# Patient Record
Sex: Male | Born: 1960
Health system: Southern US, Community
[De-identification: ages and names within clinical notes are randomized; demographics above are authoritative.]

---

## 2011-06-07 DIAGNOSIS — I2510 Atherosclerotic heart disease of native coronary artery without angina pectoris: Secondary | ICD-10-CM | POA: Diagnosis not present

## 2011-06-07 DIAGNOSIS — J019 Acute sinusitis, unspecified: Secondary | ICD-10-CM | POA: Diagnosis not present

## 2011-06-07 DIAGNOSIS — M549 Dorsalgia, unspecified: Secondary | ICD-10-CM | POA: Diagnosis not present

## 2011-06-07 DIAGNOSIS — IMO0002 Reserved for concepts with insufficient information to code with codable children: Secondary | ICD-10-CM | POA: Diagnosis not present

## 2011-06-07 DIAGNOSIS — D518 Other vitamin B12 deficiency anemias: Secondary | ICD-10-CM | POA: Diagnosis not present

## 2012-02-07 DIAGNOSIS — W010XXA Fall on same level from slipping, tripping and stumbling without subsequent striking against object, initial encounter: Secondary | ICD-10-CM | POA: Diagnosis not present

## 2012-02-07 DIAGNOSIS — M545 Low back pain, unspecified: Secondary | ICD-10-CM | POA: Diagnosis not present

## 2012-02-07 DIAGNOSIS — IMO0002 Reserved for concepts with insufficient information to code with codable children: Secondary | ICD-10-CM | POA: Diagnosis not present

## 2012-05-31 DIAGNOSIS — I2510 Atherosclerotic heart disease of native coronary artery without angina pectoris: Secondary | ICD-10-CM | POA: Diagnosis not present

## 2012-05-31 DIAGNOSIS — M545 Low back pain, unspecified: Secondary | ICD-10-CM | POA: Diagnosis not present

## 2012-05-31 DIAGNOSIS — M5430 Sciatica, unspecified side: Secondary | ICD-10-CM | POA: Diagnosis not present

## 2012-05-31 DIAGNOSIS — I252 Old myocardial infarction: Secondary | ICD-10-CM | POA: Diagnosis not present

## 2012-05-31 DIAGNOSIS — R52 Pain, unspecified: Secondary | ICD-10-CM | POA: Diagnosis not present

## 2012-05-31 DIAGNOSIS — Z8673 Personal history of transient ischemic attack (TIA), and cerebral infarction without residual deficits: Secondary | ICD-10-CM | POA: Diagnosis not present

## 2012-06-19 DIAGNOSIS — W010XXA Fall on same level from slipping, tripping and stumbling without subsequent striking against object, initial encounter: Secondary | ICD-10-CM | POA: Diagnosis not present

## 2012-06-19 DIAGNOSIS — Z9119 Patient's noncompliance with other medical treatment and regimen: Secondary | ICD-10-CM | POA: Diagnosis not present

## 2012-06-19 DIAGNOSIS — I252 Old myocardial infarction: Secondary | ICD-10-CM | POA: Diagnosis not present

## 2012-06-19 DIAGNOSIS — Z905 Acquired absence of kidney: Secondary | ICD-10-CM | POA: Diagnosis not present

## 2012-06-19 DIAGNOSIS — Z8673 Personal history of transient ischemic attack (TIA), and cerebral infarction without residual deficits: Secondary | ICD-10-CM | POA: Diagnosis not present

## 2012-06-19 DIAGNOSIS — M549 Dorsalgia, unspecified: Secondary | ICD-10-CM | POA: Diagnosis not present

## 2012-06-19 DIAGNOSIS — I4892 Unspecified atrial flutter: Secondary | ICD-10-CM | POA: Diagnosis not present

## 2012-06-19 DIAGNOSIS — Z7982 Long term (current) use of aspirin: Secondary | ICD-10-CM | POA: Diagnosis not present

## 2012-06-19 DIAGNOSIS — I359 Nonrheumatic aortic valve disorder, unspecified: Secondary | ICD-10-CM | POA: Diagnosis not present

## 2012-06-19 DIAGNOSIS — R0602 Shortness of breath: Secondary | ICD-10-CM | POA: Diagnosis not present

## 2012-06-19 DIAGNOSIS — R9431 Abnormal electrocardiogram [ECG] [EKG]: Secondary | ICD-10-CM | POA: Diagnosis not present

## 2012-06-19 DIAGNOSIS — I4891 Unspecified atrial fibrillation: Secondary | ICD-10-CM | POA: Diagnosis not present

## 2012-06-19 DIAGNOSIS — N289 Disorder of kidney and ureter, unspecified: Secondary | ICD-10-CM | POA: Diagnosis not present

## 2012-06-19 DIAGNOSIS — F17200 Nicotine dependence, unspecified, uncomplicated: Secondary | ICD-10-CM | POA: Diagnosis present

## 2012-06-19 DIAGNOSIS — Z9861 Coronary angioplasty status: Secondary | ICD-10-CM | POA: Diagnosis not present

## 2012-06-19 DIAGNOSIS — I2510 Atherosclerotic heart disease of native coronary artery without angina pectoris: Secondary | ICD-10-CM | POA: Diagnosis not present

## 2012-06-19 DIAGNOSIS — Z951 Presence of aortocoronary bypass graft: Secondary | ICD-10-CM | POA: Diagnosis not present

## 2012-06-19 DIAGNOSIS — I059 Rheumatic mitral valve disease, unspecified: Secondary | ICD-10-CM | POA: Diagnosis not present

## 2012-06-19 DIAGNOSIS — E7800 Pure hypercholesterolemia, unspecified: Secondary | ICD-10-CM | POA: Diagnosis not present

## 2012-06-30 DIAGNOSIS — L600 Ingrowing nail: Secondary | ICD-10-CM | POA: Diagnosis not present

## 2012-07-03 DIAGNOSIS — L600 Ingrowing nail: Secondary | ICD-10-CM | POA: Diagnosis not present

## 2012-07-08 DIAGNOSIS — IMO0002 Reserved for concepts with insufficient information to code with codable children: Secondary | ICD-10-CM | POA: Diagnosis not present

## 2012-07-08 DIAGNOSIS — M159 Polyosteoarthritis, unspecified: Secondary | ICD-10-CM | POA: Diagnosis not present

## 2012-07-08 DIAGNOSIS — Z886 Allergy status to analgesic agent status: Secondary | ICD-10-CM | POA: Diagnosis not present

## 2012-07-08 DIAGNOSIS — M47817 Spondylosis without myelopathy or radiculopathy, lumbosacral region: Secondary | ICD-10-CM | POA: Diagnosis not present

## 2012-07-08 DIAGNOSIS — F17200 Nicotine dependence, unspecified, uncomplicated: Secondary | ICD-10-CM | POA: Diagnosis not present

## 2012-07-08 DIAGNOSIS — Z7982 Long term (current) use of aspirin: Secondary | ICD-10-CM | POA: Diagnosis not present

## 2012-07-08 DIAGNOSIS — I10 Essential (primary) hypertension: Secondary | ICD-10-CM | POA: Diagnosis not present

## 2012-07-08 DIAGNOSIS — Z91041 Radiographic dye allergy status: Secondary | ICD-10-CM | POA: Diagnosis not present

## 2012-07-08 DIAGNOSIS — Z79899 Other long term (current) drug therapy: Secondary | ICD-10-CM | POA: Diagnosis not present

## 2012-07-25 DIAGNOSIS — S8000XA Contusion of unspecified knee, initial encounter: Secondary | ICD-10-CM | POA: Diagnosis not present

## 2012-07-25 DIAGNOSIS — M549 Dorsalgia, unspecified: Secondary | ICD-10-CM | POA: Diagnosis not present

## 2012-07-25 DIAGNOSIS — F411 Generalized anxiety disorder: Secondary | ICD-10-CM | POA: Diagnosis not present

## 2012-08-09 DIAGNOSIS — M928 Other specified juvenile osteochondrosis: Secondary | ICD-10-CM | POA: Diagnosis not present

## 2012-08-09 DIAGNOSIS — S8000XA Contusion of unspecified knee, initial encounter: Secondary | ICD-10-CM | POA: Diagnosis not present

## 2012-08-17 DIAGNOSIS — M4802 Spinal stenosis, cervical region: Secondary | ICD-10-CM | POA: Diagnosis not present

## 2012-08-24 DIAGNOSIS — M5030 Other cervical disc degeneration, unspecified cervical region: Secondary | ICD-10-CM | POA: Diagnosis not present

## 2012-08-24 DIAGNOSIS — M5137 Other intervertebral disc degeneration, lumbosacral region: Secondary | ICD-10-CM | POA: Diagnosis not present

## 2012-08-24 DIAGNOSIS — J323 Chronic sphenoidal sinusitis: Secondary | ICD-10-CM | POA: Diagnosis not present

## 2012-08-24 DIAGNOSIS — M47812 Spondylosis without myelopathy or radiculopathy, cervical region: Secondary | ICD-10-CM | POA: Diagnosis not present

## 2012-08-28 DIAGNOSIS — I2510 Atherosclerotic heart disease of native coronary artery without angina pectoris: Secondary | ICD-10-CM | POA: Diagnosis not present

## 2012-08-28 DIAGNOSIS — F411 Generalized anxiety disorder: Secondary | ICD-10-CM | POA: Diagnosis not present

## 2012-08-28 DIAGNOSIS — I10 Essential (primary) hypertension: Secondary | ICD-10-CM | POA: Diagnosis not present

## 2012-08-28 DIAGNOSIS — I252 Old myocardial infarction: Secondary | ICD-10-CM | POA: Diagnosis not present

## 2012-08-28 DIAGNOSIS — R079 Chest pain, unspecified: Secondary | ICD-10-CM | POA: Diagnosis not present

## 2012-08-28 DIAGNOSIS — Z8673 Personal history of transient ischemic attack (TIA), and cerebral infarction without residual deficits: Secondary | ICD-10-CM | POA: Diagnosis not present

## 2012-08-30 DIAGNOSIS — M5020 Other cervical disc displacement, unspecified cervical region: Secondary | ICD-10-CM | POA: Diagnosis not present

## 2012-08-30 DIAGNOSIS — M5030 Other cervical disc degeneration, unspecified cervical region: Secondary | ICD-10-CM | POA: Diagnosis not present

## 2012-08-30 DIAGNOSIS — IMO0002 Reserved for concepts with insufficient information to code with codable children: Secondary | ICD-10-CM | POA: Diagnosis not present

## 2012-12-25 DIAGNOSIS — E785 Hyperlipidemia, unspecified: Secondary | ICD-10-CM | POA: Diagnosis not present

## 2012-12-25 DIAGNOSIS — N138 Other obstructive and reflux uropathy: Secondary | ICD-10-CM | POA: Diagnosis not present

## 2012-12-25 DIAGNOSIS — M549 Dorsalgia, unspecified: Secondary | ICD-10-CM | POA: Diagnosis not present

## 2012-12-25 DIAGNOSIS — N401 Enlarged prostate with lower urinary tract symptoms: Secondary | ICD-10-CM | POA: Diagnosis not present

## 2012-12-25 DIAGNOSIS — I2510 Atherosclerotic heart disease of native coronary artery without angina pectoris: Secondary | ICD-10-CM | POA: Diagnosis not present

## 2012-12-25 DIAGNOSIS — R109 Unspecified abdominal pain: Secondary | ICD-10-CM | POA: Diagnosis not present

## 2013-01-11 DIAGNOSIS — Q619 Cystic kidney disease, unspecified: Secondary | ICD-10-CM | POA: Diagnosis not present

## 2013-01-11 DIAGNOSIS — R109 Unspecified abdominal pain: Secondary | ICD-10-CM | POA: Diagnosis not present

## 2013-01-11 DIAGNOSIS — M549 Dorsalgia, unspecified: Secondary | ICD-10-CM | POA: Diagnosis not present

## 2013-01-11 DIAGNOSIS — Z9889 Other specified postprocedural states: Secondary | ICD-10-CM | POA: Diagnosis not present

## 2013-01-24 DIAGNOSIS — M549 Dorsalgia, unspecified: Secondary | ICD-10-CM | POA: Diagnosis not present

## 2013-01-24 DIAGNOSIS — Z8673 Personal history of transient ischemic attack (TIA), and cerebral infarction without residual deficits: Secondary | ICD-10-CM | POA: Diagnosis not present

## 2013-01-24 DIAGNOSIS — I2510 Atherosclerotic heart disease of native coronary artery without angina pectoris: Secondary | ICD-10-CM | POA: Diagnosis not present

## 2013-01-24 DIAGNOSIS — R002 Palpitations: Secondary | ICD-10-CM | POA: Diagnosis not present

## 2013-01-24 DIAGNOSIS — I4891 Unspecified atrial fibrillation: Secondary | ICD-10-CM | POA: Diagnosis not present

## 2013-01-24 DIAGNOSIS — I10 Essential (primary) hypertension: Secondary | ICD-10-CM | POA: Diagnosis not present

## 2013-01-24 DIAGNOSIS — I252 Old myocardial infarction: Secondary | ICD-10-CM | POA: Diagnosis not present

## 2013-02-16 DIAGNOSIS — M5137 Other intervertebral disc degeneration, lumbosacral region: Secondary | ICD-10-CM | POA: Diagnosis not present

## 2013-02-16 DIAGNOSIS — IMO0002 Reserved for concepts with insufficient information to code with codable children: Secondary | ICD-10-CM | POA: Diagnosis not present

## 2013-02-21 DIAGNOSIS — M549 Dorsalgia, unspecified: Secondary | ICD-10-CM | POA: Diagnosis not present

## 2013-02-21 DIAGNOSIS — Z23 Encounter for immunization: Secondary | ICD-10-CM | POA: Diagnosis not present

## 2013-02-21 DIAGNOSIS — R109 Unspecified abdominal pain: Secondary | ICD-10-CM | POA: Diagnosis not present

## 2013-02-21 DIAGNOSIS — I10 Essential (primary) hypertension: Secondary | ICD-10-CM | POA: Diagnosis not present

## 2013-02-21 DIAGNOSIS — E782 Mixed hyperlipidemia: Secondary | ICD-10-CM | POA: Diagnosis not present

## 2013-03-27 DIAGNOSIS — I2510 Atherosclerotic heart disease of native coronary artery without angina pectoris: Secondary | ICD-10-CM | POA: Diagnosis not present

## 2013-03-27 DIAGNOSIS — M549 Dorsalgia, unspecified: Secondary | ICD-10-CM | POA: Diagnosis not present

## 2013-03-27 DIAGNOSIS — E785 Hyperlipidemia, unspecified: Secondary | ICD-10-CM | POA: Diagnosis not present

## 2013-04-25 DIAGNOSIS — I4891 Unspecified atrial fibrillation: Secondary | ICD-10-CM | POA: Diagnosis not present

## 2013-04-25 DIAGNOSIS — E785 Hyperlipidemia, unspecified: Secondary | ICD-10-CM | POA: Diagnosis not present

## 2013-04-25 DIAGNOSIS — M549 Dorsalgia, unspecified: Secondary | ICD-10-CM | POA: Diagnosis not present

## 2013-04-25 DIAGNOSIS — I10 Essential (primary) hypertension: Secondary | ICD-10-CM | POA: Diagnosis not present

## 2013-05-28 DIAGNOSIS — Z79899 Other long term (current) drug therapy: Secondary | ICD-10-CM | POA: Diagnosis not present

## 2013-05-28 DIAGNOSIS — R197 Diarrhea, unspecified: Secondary | ICD-10-CM | POA: Diagnosis not present

## 2013-05-28 DIAGNOSIS — J00 Acute nasopharyngitis [common cold]: Secondary | ICD-10-CM | POA: Diagnosis not present

## 2013-05-28 DIAGNOSIS — M549 Dorsalgia, unspecified: Secondary | ICD-10-CM | POA: Diagnosis not present

## 2013-05-28 DIAGNOSIS — E785 Hyperlipidemia, unspecified: Secondary | ICD-10-CM | POA: Diagnosis not present

## 2013-06-26 DIAGNOSIS — E785 Hyperlipidemia, unspecified: Secondary | ICD-10-CM | POA: Diagnosis not present

## 2013-06-26 DIAGNOSIS — M549 Dorsalgia, unspecified: Secondary | ICD-10-CM | POA: Diagnosis not present

## 2013-06-26 DIAGNOSIS — Z79899 Other long term (current) drug therapy: Secondary | ICD-10-CM | POA: Diagnosis not present

## 2013-06-26 DIAGNOSIS — N401 Enlarged prostate with lower urinary tract symptoms: Secondary | ICD-10-CM | POA: Diagnosis not present

## 2013-06-26 DIAGNOSIS — F411 Generalized anxiety disorder: Secondary | ICD-10-CM | POA: Diagnosis not present

## 2013-07-25 DIAGNOSIS — E785 Hyperlipidemia, unspecified: Secondary | ICD-10-CM | POA: Diagnosis not present

## 2013-07-25 DIAGNOSIS — Z79899 Other long term (current) drug therapy: Secondary | ICD-10-CM | POA: Diagnosis not present

## 2013-07-25 DIAGNOSIS — M549 Dorsalgia, unspecified: Secondary | ICD-10-CM | POA: Diagnosis not present

## 2013-08-31 DIAGNOSIS — R0609 Other forms of dyspnea: Secondary | ICD-10-CM | POA: Diagnosis not present

## 2013-08-31 DIAGNOSIS — Z79899 Other long term (current) drug therapy: Secondary | ICD-10-CM | POA: Diagnosis not present

## 2013-08-31 DIAGNOSIS — R0989 Other specified symptoms and signs involving the circulatory and respiratory systems: Secondary | ICD-10-CM | POA: Diagnosis not present

## 2013-08-31 DIAGNOSIS — M549 Dorsalgia, unspecified: Secondary | ICD-10-CM | POA: Diagnosis not present

## 2013-09-10 DIAGNOSIS — Z79899 Other long term (current) drug therapy: Secondary | ICD-10-CM | POA: Diagnosis not present

## 2013-09-10 DIAGNOSIS — I252 Old myocardial infarction: Secondary | ICD-10-CM | POA: Diagnosis not present

## 2013-09-10 DIAGNOSIS — I10 Essential (primary) hypertension: Secondary | ICD-10-CM | POA: Diagnosis not present

## 2013-09-10 DIAGNOSIS — J209 Acute bronchitis, unspecified: Secondary | ICD-10-CM | POA: Diagnosis not present

## 2013-09-10 DIAGNOSIS — R0602 Shortness of breath: Secondary | ICD-10-CM | POA: Diagnosis not present

## 2013-09-10 DIAGNOSIS — I4891 Unspecified atrial fibrillation: Secondary | ICD-10-CM | POA: Diagnosis not present

## 2013-09-10 DIAGNOSIS — E785 Hyperlipidemia, unspecified: Secondary | ICD-10-CM | POA: Diagnosis not present

## 2013-09-10 DIAGNOSIS — I2510 Atherosclerotic heart disease of native coronary artery without angina pectoris: Secondary | ICD-10-CM | POA: Diagnosis not present

## 2013-09-10 DIAGNOSIS — R079 Chest pain, unspecified: Secondary | ICD-10-CM | POA: Diagnosis not present

## 2013-09-24 DIAGNOSIS — I501 Left ventricular failure: Secondary | ICD-10-CM | POA: Diagnosis not present

## 2013-09-24 DIAGNOSIS — F411 Generalized anxiety disorder: Secondary | ICD-10-CM | POA: Diagnosis present

## 2013-09-24 DIAGNOSIS — Z23 Encounter for immunization: Secondary | ICD-10-CM | POA: Diagnosis not present

## 2013-09-24 DIAGNOSIS — I2510 Atherosclerotic heart disease of native coronary artery without angina pectoris: Secondary | ICD-10-CM | POA: Diagnosis not present

## 2013-09-24 DIAGNOSIS — J180 Bronchopneumonia, unspecified organism: Secondary | ICD-10-CM | POA: Diagnosis not present

## 2013-09-24 DIAGNOSIS — Z7902 Long term (current) use of antithrombotics/antiplatelets: Secondary | ICD-10-CM | POA: Diagnosis not present

## 2013-09-24 DIAGNOSIS — Z87891 Personal history of nicotine dependence: Secondary | ICD-10-CM | POA: Diagnosis not present

## 2013-09-24 DIAGNOSIS — I10 Essential (primary) hypertension: Secondary | ICD-10-CM | POA: Diagnosis present

## 2013-09-24 DIAGNOSIS — R29898 Other symptoms and signs involving the musculoskeletal system: Secondary | ICD-10-CM | POA: Diagnosis not present

## 2013-09-24 DIAGNOSIS — F3289 Other specified depressive episodes: Secondary | ICD-10-CM | POA: Diagnosis present

## 2013-09-24 DIAGNOSIS — I69998 Other sequelae following unspecified cerebrovascular disease: Secondary | ICD-10-CM | POA: Diagnosis not present

## 2013-09-24 DIAGNOSIS — R51 Headache: Secondary | ICD-10-CM | POA: Diagnosis not present

## 2013-09-24 DIAGNOSIS — R079 Chest pain, unspecified: Secondary | ICD-10-CM | POA: Diagnosis not present

## 2013-09-24 DIAGNOSIS — F329 Major depressive disorder, single episode, unspecified: Secondary | ICD-10-CM | POA: Diagnosis present

## 2013-09-24 DIAGNOSIS — Z79899 Other long term (current) drug therapy: Secondary | ICD-10-CM | POA: Diagnosis not present

## 2013-09-24 DIAGNOSIS — I509 Heart failure, unspecified: Secondary | ICD-10-CM | POA: Diagnosis not present

## 2013-09-24 DIAGNOSIS — I428 Other cardiomyopathies: Secondary | ICD-10-CM | POA: Diagnosis not present

## 2013-09-24 DIAGNOSIS — Z7982 Long term (current) use of aspirin: Secondary | ICD-10-CM | POA: Diagnosis not present

## 2013-09-24 DIAGNOSIS — I359 Nonrheumatic aortic valve disorder, unspecified: Secondary | ICD-10-CM | POA: Diagnosis not present

## 2013-09-24 DIAGNOSIS — R002 Palpitations: Secondary | ICD-10-CM | POA: Diagnosis not present

## 2013-09-24 DIAGNOSIS — Z905 Acquired absence of kidney: Secondary | ICD-10-CM | POA: Diagnosis not present

## 2013-09-24 DIAGNOSIS — Z91199 Patient's noncompliance with other medical treatment and regimen due to unspecified reason: Secondary | ICD-10-CM | POA: Diagnosis not present

## 2013-09-24 DIAGNOSIS — Z951 Presence of aortocoronary bypass graft: Secondary | ICD-10-CM | POA: Diagnosis not present

## 2013-09-24 DIAGNOSIS — I4891 Unspecified atrial fibrillation: Secondary | ICD-10-CM | POA: Diagnosis not present

## 2013-09-24 DIAGNOSIS — I369 Nonrheumatic tricuspid valve disorder, unspecified: Secondary | ICD-10-CM | POA: Diagnosis not present

## 2013-09-24 DIAGNOSIS — N179 Acute kidney failure, unspecified: Secondary | ICD-10-CM | POA: Diagnosis not present

## 2013-09-24 DIAGNOSIS — I059 Rheumatic mitral valve disease, unspecified: Secondary | ICD-10-CM | POA: Diagnosis not present

## 2013-09-24 DIAGNOSIS — I252 Old myocardial infarction: Secondary | ICD-10-CM | POA: Diagnosis not present

## 2013-09-24 DIAGNOSIS — Z9119 Patient's noncompliance with other medical treatment and regimen: Secondary | ICD-10-CM | POA: Diagnosis not present

## 2013-09-28 DIAGNOSIS — M549 Dorsalgia, unspecified: Secondary | ICD-10-CM | POA: Diagnosis not present

## 2013-09-28 DIAGNOSIS — J189 Pneumonia, unspecified organism: Secondary | ICD-10-CM | POA: Diagnosis not present

## 2013-09-28 DIAGNOSIS — I4891 Unspecified atrial fibrillation: Secondary | ICD-10-CM | POA: Diagnosis not present

## 2013-09-28 DIAGNOSIS — I2510 Atherosclerotic heart disease of native coronary artery without angina pectoris: Secondary | ICD-10-CM | POA: Diagnosis not present

## 2013-10-01 DIAGNOSIS — I2510 Atherosclerotic heart disease of native coronary artery without angina pectoris: Secondary | ICD-10-CM | POA: Diagnosis not present

## 2013-10-01 DIAGNOSIS — J189 Pneumonia, unspecified organism: Secondary | ICD-10-CM | POA: Diagnosis not present

## 2013-10-01 DIAGNOSIS — I4891 Unspecified atrial fibrillation: Secondary | ICD-10-CM | POA: Diagnosis not present

## 2013-10-01 DIAGNOSIS — I509 Heart failure, unspecified: Secondary | ICD-10-CM | POA: Diagnosis not present

## 2013-10-04 DIAGNOSIS — J189 Pneumonia, unspecified organism: Secondary | ICD-10-CM | POA: Diagnosis not present

## 2013-10-04 DIAGNOSIS — I509 Heart failure, unspecified: Secondary | ICD-10-CM | POA: Diagnosis not present

## 2013-10-04 DIAGNOSIS — I4891 Unspecified atrial fibrillation: Secondary | ICD-10-CM | POA: Diagnosis not present

## 2013-10-04 DIAGNOSIS — I10 Essential (primary) hypertension: Secondary | ICD-10-CM | POA: Diagnosis not present

## 2013-10-04 DIAGNOSIS — I428 Other cardiomyopathies: Secondary | ICD-10-CM | POA: Diagnosis not present

## 2013-10-04 DIAGNOSIS — I2510 Atherosclerotic heart disease of native coronary artery without angina pectoris: Secondary | ICD-10-CM | POA: Diagnosis not present

## 2013-10-04 DIAGNOSIS — E785 Hyperlipidemia, unspecified: Secondary | ICD-10-CM | POA: Diagnosis not present

## 2013-10-09 DIAGNOSIS — I4891 Unspecified atrial fibrillation: Secondary | ICD-10-CM | POA: Diagnosis not present

## 2013-10-09 DIAGNOSIS — I2510 Atherosclerotic heart disease of native coronary artery without angina pectoris: Secondary | ICD-10-CM | POA: Diagnosis not present

## 2013-10-09 DIAGNOSIS — J189 Pneumonia, unspecified organism: Secondary | ICD-10-CM | POA: Diagnosis not present

## 2013-10-09 DIAGNOSIS — I509 Heart failure, unspecified: Secondary | ICD-10-CM | POA: Diagnosis not present

## 2013-10-11 DIAGNOSIS — I2510 Atherosclerotic heart disease of native coronary artery without angina pectoris: Secondary | ICD-10-CM | POA: Diagnosis not present

## 2013-10-11 DIAGNOSIS — J189 Pneumonia, unspecified organism: Secondary | ICD-10-CM | POA: Diagnosis not present

## 2013-10-11 DIAGNOSIS — I509 Heart failure, unspecified: Secondary | ICD-10-CM | POA: Diagnosis not present

## 2013-10-11 DIAGNOSIS — I4891 Unspecified atrial fibrillation: Secondary | ICD-10-CM | POA: Diagnosis not present

## 2013-10-16 DIAGNOSIS — I2510 Atherosclerotic heart disease of native coronary artery without angina pectoris: Secondary | ICD-10-CM | POA: Diagnosis not present

## 2013-10-16 DIAGNOSIS — I4891 Unspecified atrial fibrillation: Secondary | ICD-10-CM | POA: Diagnosis not present

## 2013-10-16 DIAGNOSIS — I509 Heart failure, unspecified: Secondary | ICD-10-CM | POA: Diagnosis not present

## 2013-10-16 DIAGNOSIS — J189 Pneumonia, unspecified organism: Secondary | ICD-10-CM | POA: Diagnosis not present

## 2013-10-18 DIAGNOSIS — I4891 Unspecified atrial fibrillation: Secondary | ICD-10-CM | POA: Diagnosis not present

## 2013-10-18 DIAGNOSIS — J189 Pneumonia, unspecified organism: Secondary | ICD-10-CM | POA: Diagnosis not present

## 2013-10-18 DIAGNOSIS — I509 Heart failure, unspecified: Secondary | ICD-10-CM | POA: Diagnosis not present

## 2013-10-18 DIAGNOSIS — I2510 Atherosclerotic heart disease of native coronary artery without angina pectoris: Secondary | ICD-10-CM | POA: Diagnosis not present

## 2013-10-24 DIAGNOSIS — J189 Pneumonia, unspecified organism: Secondary | ICD-10-CM | POA: Diagnosis not present

## 2013-10-24 DIAGNOSIS — I2510 Atherosclerotic heart disease of native coronary artery without angina pectoris: Secondary | ICD-10-CM | POA: Diagnosis not present

## 2013-10-24 DIAGNOSIS — I4891 Unspecified atrial fibrillation: Secondary | ICD-10-CM | POA: Diagnosis not present

## 2013-10-24 DIAGNOSIS — I509 Heart failure, unspecified: Secondary | ICD-10-CM | POA: Diagnosis not present

## 2013-10-26 DIAGNOSIS — Z79899 Other long term (current) drug therapy: Secondary | ICD-10-CM | POA: Diagnosis not present

## 2013-10-26 DIAGNOSIS — R0989 Other specified symptoms and signs involving the circulatory and respiratory systems: Secondary | ICD-10-CM | POA: Diagnosis not present

## 2013-10-26 DIAGNOSIS — I4891 Unspecified atrial fibrillation: Secondary | ICD-10-CM | POA: Diagnosis not present

## 2013-10-26 DIAGNOSIS — M549 Dorsalgia, unspecified: Secondary | ICD-10-CM | POA: Diagnosis not present

## 2013-10-26 DIAGNOSIS — R112 Nausea with vomiting, unspecified: Secondary | ICD-10-CM | POA: Diagnosis not present

## 2013-10-26 DIAGNOSIS — R0609 Other forms of dyspnea: Secondary | ICD-10-CM | POA: Diagnosis not present

## 2013-10-27 DIAGNOSIS — Z5189 Encounter for other specified aftercare: Secondary | ICD-10-CM | POA: Diagnosis not present

## 2013-10-27 DIAGNOSIS — N179 Acute kidney failure, unspecified: Secondary | ICD-10-CM | POA: Diagnosis not present

## 2013-10-27 DIAGNOSIS — J984 Other disorders of lung: Secondary | ICD-10-CM | POA: Diagnosis not present

## 2013-10-27 DIAGNOSIS — I5020 Unspecified systolic (congestive) heart failure: Secondary | ICD-10-CM | POA: Diagnosis not present

## 2013-10-27 DIAGNOSIS — E7800 Pure hypercholesterolemia, unspecified: Secondary | ICD-10-CM | POA: Diagnosis not present

## 2013-10-27 DIAGNOSIS — D649 Anemia, unspecified: Secondary | ICD-10-CM | POA: Diagnosis not present

## 2013-10-27 DIAGNOSIS — I447 Left bundle-branch block, unspecified: Secondary | ICD-10-CM | POA: Diagnosis not present

## 2013-10-27 DIAGNOSIS — R42 Dizziness and giddiness: Secondary | ICD-10-CM | POA: Diagnosis not present

## 2013-10-27 DIAGNOSIS — E871 Hypo-osmolality and hyponatremia: Secondary | ICD-10-CM | POA: Diagnosis not present

## 2013-10-27 DIAGNOSIS — K766 Portal hypertension: Secondary | ICD-10-CM | POA: Diagnosis not present

## 2013-10-27 DIAGNOSIS — I6350 Cerebral infarction due to unspecified occlusion or stenosis of unspecified cerebral artery: Secondary | ICD-10-CM | POA: Diagnosis not present

## 2013-10-27 DIAGNOSIS — I5031 Acute diastolic (congestive) heart failure: Secondary | ICD-10-CM | POA: Diagnosis not present

## 2013-10-27 DIAGNOSIS — R4789 Other speech disturbances: Secondary | ICD-10-CM | POA: Diagnosis not present

## 2013-10-27 DIAGNOSIS — R279 Unspecified lack of coordination: Secondary | ICD-10-CM | POA: Diagnosis not present

## 2013-10-27 DIAGNOSIS — D72829 Elevated white blood cell count, unspecified: Secondary | ICD-10-CM | POA: Diagnosis not present

## 2013-10-27 DIAGNOSIS — I517 Cardiomegaly: Secondary | ICD-10-CM | POA: Diagnosis not present

## 2013-10-27 DIAGNOSIS — G63 Polyneuropathy in diseases classified elsewhere: Secondary | ICD-10-CM | POA: Diagnosis not present

## 2013-10-27 DIAGNOSIS — D696 Thrombocytopenia, unspecified: Secondary | ICD-10-CM | POA: Diagnosis not present

## 2013-10-27 DIAGNOSIS — Z95810 Presence of automatic (implantable) cardiac defibrillator: Secondary | ICD-10-CM | POA: Diagnosis not present

## 2013-10-27 DIAGNOSIS — I10 Essential (primary) hypertension: Secondary | ICD-10-CM | POA: Diagnosis not present

## 2013-10-27 DIAGNOSIS — I69998 Other sequelae following unspecified cerebrovascular disease: Secondary | ICD-10-CM | POA: Diagnosis not present

## 2013-10-27 DIAGNOSIS — I2510 Atherosclerotic heart disease of native coronary artery without angina pectoris: Secondary | ICD-10-CM | POA: Diagnosis present

## 2013-10-27 DIAGNOSIS — A419 Sepsis, unspecified organism: Secondary | ICD-10-CM | POA: Diagnosis not present

## 2013-10-27 DIAGNOSIS — J811 Chronic pulmonary edema: Secondary | ICD-10-CM | POA: Diagnosis not present

## 2013-10-27 DIAGNOSIS — I5023 Acute on chronic systolic (congestive) heart failure: Secondary | ICD-10-CM | POA: Diagnosis not present

## 2013-10-27 DIAGNOSIS — R29818 Other symptoms and signs involving the nervous system: Secondary | ICD-10-CM | POA: Diagnosis present

## 2013-10-27 DIAGNOSIS — I6789 Other cerebrovascular disease: Secondary | ICD-10-CM | POA: Diagnosis not present

## 2013-10-27 DIAGNOSIS — M6281 Muscle weakness (generalized): Secondary | ICD-10-CM | POA: Diagnosis not present

## 2013-10-27 DIAGNOSIS — N281 Cyst of kidney, acquired: Secondary | ICD-10-CM | POA: Diagnosis not present

## 2013-10-27 DIAGNOSIS — R509 Fever, unspecified: Secondary | ICD-10-CM | POA: Diagnosis not present

## 2013-10-27 DIAGNOSIS — I9589 Other hypotension: Secondary | ICD-10-CM | POA: Diagnosis not present

## 2013-10-27 DIAGNOSIS — R51 Headache: Secondary | ICD-10-CM | POA: Diagnosis not present

## 2013-10-27 DIAGNOSIS — I5022 Chronic systolic (congestive) heart failure: Secondary | ICD-10-CM | POA: Diagnosis not present

## 2013-10-27 DIAGNOSIS — J449 Chronic obstructive pulmonary disease, unspecified: Secondary | ICD-10-CM | POA: Diagnosis present

## 2013-10-27 DIAGNOSIS — Z7901 Long term (current) use of anticoagulants: Secondary | ICD-10-CM | POA: Diagnosis not present

## 2013-10-27 DIAGNOSIS — Z79899 Other long term (current) drug therapy: Secondary | ICD-10-CM | POA: Diagnosis not present

## 2013-10-27 DIAGNOSIS — E875 Hyperkalemia: Secondary | ICD-10-CM | POA: Diagnosis not present

## 2013-10-27 DIAGNOSIS — R6520 Severe sepsis without septic shock: Secondary | ICD-10-CM | POA: Diagnosis not present

## 2013-10-27 DIAGNOSIS — I129 Hypertensive chronic kidney disease with stage 1 through stage 4 chronic kidney disease, or unspecified chronic kidney disease: Secondary | ICD-10-CM | POA: Diagnosis present

## 2013-10-27 DIAGNOSIS — E7210 Disorders of sulfur-bearing amino-acid metabolism, unspecified: Secondary | ICD-10-CM | POA: Diagnosis not present

## 2013-10-27 DIAGNOSIS — I252 Old myocardial infarction: Secondary | ICD-10-CM | POA: Diagnosis not present

## 2013-10-27 DIAGNOSIS — G9340 Encephalopathy, unspecified: Secondary | ICD-10-CM | POA: Diagnosis not present

## 2013-10-27 DIAGNOSIS — R7401 Elevation of levels of liver transaminase levels: Secondary | ICD-10-CM | POA: Diagnosis not present

## 2013-10-27 DIAGNOSIS — I509 Heart failure, unspecified: Secondary | ICD-10-CM | POA: Diagnosis not present

## 2013-10-27 DIAGNOSIS — I2589 Other forms of chronic ischemic heart disease: Secondary | ICD-10-CM | POA: Diagnosis not present

## 2013-10-27 DIAGNOSIS — N183 Chronic kidney disease, stage 3 unspecified: Secondary | ICD-10-CM | POA: Diagnosis not present

## 2013-10-27 DIAGNOSIS — Z006 Encounter for examination for normal comparison and control in clinical research program: Secondary | ICD-10-CM | POA: Diagnosis not present

## 2013-10-27 DIAGNOSIS — I6330 Cerebral infarction due to thrombosis of unspecified cerebral artery: Secondary | ICD-10-CM | POA: Diagnosis not present

## 2013-10-27 DIAGNOSIS — I959 Hypotension, unspecified: Secondary | ICD-10-CM | POA: Diagnosis not present

## 2013-10-27 DIAGNOSIS — F29 Unspecified psychosis not due to a substance or known physiological condition: Secondary | ICD-10-CM | POA: Diagnosis not present

## 2013-10-27 DIAGNOSIS — R5381 Other malaise: Secondary | ICD-10-CM | POA: Diagnosis not present

## 2013-10-27 DIAGNOSIS — Z951 Presence of aortocoronary bypass graft: Secondary | ICD-10-CM | POA: Diagnosis not present

## 2013-10-27 DIAGNOSIS — R5383 Other fatigue: Secondary | ICD-10-CM | POA: Diagnosis not present

## 2013-10-27 DIAGNOSIS — Z87891 Personal history of nicotine dependence: Secondary | ICD-10-CM | POA: Diagnosis not present

## 2013-10-27 DIAGNOSIS — M7989 Other specified soft tissue disorders: Secondary | ICD-10-CM | POA: Diagnosis not present

## 2013-10-27 DIAGNOSIS — R7989 Other specified abnormal findings of blood chemistry: Secondary | ICD-10-CM | POA: Diagnosis not present

## 2013-10-27 DIAGNOSIS — R0602 Shortness of breath: Secondary | ICD-10-CM | POA: Diagnosis not present

## 2013-10-27 DIAGNOSIS — R262 Difficulty in walking, not elsewhere classified: Secondary | ICD-10-CM | POA: Diagnosis not present

## 2013-10-27 DIAGNOSIS — I4892 Unspecified atrial flutter: Secondary | ICD-10-CM | POA: Diagnosis not present

## 2013-10-27 DIAGNOSIS — Z9861 Coronary angioplasty status: Secondary | ICD-10-CM | POA: Diagnosis not present

## 2013-10-27 DIAGNOSIS — I4891 Unspecified atrial fibrillation: Secondary | ICD-10-CM | POA: Diagnosis not present

## 2013-10-27 DIAGNOSIS — I428 Other cardiomyopathies: Secondary | ICD-10-CM | POA: Diagnosis present

## 2013-10-27 DIAGNOSIS — G459 Transient cerebral ischemic attack, unspecified: Secondary | ICD-10-CM | POA: Diagnosis not present

## 2013-10-27 DIAGNOSIS — I369 Nonrheumatic tricuspid valve disorder, unspecified: Secondary | ICD-10-CM | POA: Diagnosis not present

## 2013-10-27 DIAGNOSIS — E872 Acidosis, unspecified: Secondary | ICD-10-CM | POA: Diagnosis not present

## 2013-10-27 DIAGNOSIS — K7200 Acute and subacute hepatic failure without coma: Secondary | ICD-10-CM | POA: Diagnosis not present

## 2013-10-27 DIAGNOSIS — I5021 Acute systolic (congestive) heart failure: Secondary | ICD-10-CM | POA: Diagnosis not present

## 2013-10-27 DIAGNOSIS — J90 Pleural effusion, not elsewhere classified: Secondary | ICD-10-CM | POA: Diagnosis not present

## 2013-10-27 DIAGNOSIS — I82409 Acute embolism and thrombosis of unspecified deep veins of unspecified lower extremity: Secondary | ICD-10-CM | POA: Diagnosis not present

## 2013-10-27 DIAGNOSIS — G8190 Hemiplegia, unspecified affecting unspecified side: Secondary | ICD-10-CM | POA: Diagnosis not present

## 2013-10-27 DIAGNOSIS — Z905 Acquired absence of kidney: Secondary | ICD-10-CM | POA: Diagnosis not present

## 2013-10-29 DIAGNOSIS — I63539 Cerebral infarction due to unspecified occlusion or stenosis of unspecified posterior cerebral artery: Secondary | ICD-10-CM | POA: Insufficient documentation

## 2013-11-03 DIAGNOSIS — N179 Acute kidney failure, unspecified: Secondary | ICD-10-CM | POA: Insufficient documentation

## 2013-11-09 DIAGNOSIS — N183 Chronic kidney disease, stage 3 unspecified: Secondary | ICD-10-CM | POA: Insufficient documentation

## 2013-11-09 DIAGNOSIS — Z7901 Long term (current) use of anticoagulants: Secondary | ICD-10-CM | POA: Insufficient documentation

## 2013-11-12 DIAGNOSIS — Z7901 Long term (current) use of anticoagulants: Secondary | ICD-10-CM | POA: Diagnosis not present

## 2013-11-12 DIAGNOSIS — I69998 Other sequelae following unspecified cerebrovascular disease: Secondary | ICD-10-CM | POA: Diagnosis not present

## 2013-11-12 DIAGNOSIS — R262 Difficulty in walking, not elsewhere classified: Secondary | ICD-10-CM | POA: Diagnosis not present

## 2013-11-12 DIAGNOSIS — R5381 Other malaise: Secondary | ICD-10-CM | POA: Diagnosis not present

## 2013-11-12 DIAGNOSIS — I509 Heart failure, unspecified: Secondary | ICD-10-CM | POA: Insufficient documentation

## 2013-11-12 DIAGNOSIS — I4891 Unspecified atrial fibrillation: Secondary | ICD-10-CM | POA: Diagnosis not present

## 2013-11-12 DIAGNOSIS — I6789 Other cerebrovascular disease: Secondary | ICD-10-CM | POA: Diagnosis not present

## 2013-11-12 DIAGNOSIS — Z5189 Encounter for other specified aftercare: Secondary | ICD-10-CM | POA: Diagnosis not present

## 2013-11-12 DIAGNOSIS — M6281 Muscle weakness (generalized): Secondary | ICD-10-CM | POA: Diagnosis not present

## 2013-11-12 DIAGNOSIS — I5020 Unspecified systolic (congestive) heart failure: Secondary | ICD-10-CM | POA: Diagnosis not present

## 2013-11-12 DIAGNOSIS — I6350 Cerebral infarction due to unspecified occlusion or stenosis of unspecified cerebral artery: Secondary | ICD-10-CM | POA: Diagnosis not present

## 2013-11-12 DIAGNOSIS — Z951 Presence of aortocoronary bypass graft: Secondary | ICD-10-CM | POA: Diagnosis not present

## 2013-11-12 DIAGNOSIS — I2510 Atherosclerotic heart disease of native coronary artery without angina pectoris: Secondary | ICD-10-CM | POA: Diagnosis not present

## 2013-11-12 DIAGNOSIS — R279 Unspecified lack of coordination: Secondary | ICD-10-CM | POA: Diagnosis not present

## 2013-11-12 DIAGNOSIS — G63 Polyneuropathy in diseases classified elsewhere: Secondary | ICD-10-CM | POA: Diagnosis not present

## 2013-11-21 DIAGNOSIS — R5381 Other malaise: Secondary | ICD-10-CM | POA: Diagnosis not present

## 2013-11-21 DIAGNOSIS — I509 Heart failure, unspecified: Secondary | ICD-10-CM | POA: Diagnosis not present

## 2013-11-21 DIAGNOSIS — I69998 Other sequelae following unspecified cerebrovascular disease: Secondary | ICD-10-CM | POA: Diagnosis not present

## 2013-11-21 DIAGNOSIS — I6789 Other cerebrovascular disease: Secondary | ICD-10-CM | POA: Diagnosis not present

## 2013-11-21 DIAGNOSIS — G63 Polyneuropathy in diseases classified elsewhere: Secondary | ICD-10-CM | POA: Diagnosis not present

## 2013-11-21 DIAGNOSIS — R262 Difficulty in walking, not elsewhere classified: Secondary | ICD-10-CM | POA: Diagnosis not present

## 2013-11-21 DIAGNOSIS — R279 Unspecified lack of coordination: Secondary | ICD-10-CM | POA: Diagnosis not present

## 2013-11-21 DIAGNOSIS — Z5189 Encounter for other specified aftercare: Secondary | ICD-10-CM | POA: Diagnosis not present

## 2013-11-21 DIAGNOSIS — M6281 Muscle weakness (generalized): Secondary | ICD-10-CM | POA: Diagnosis not present

## 2013-12-05 DIAGNOSIS — Z7901 Long term (current) use of anticoagulants: Secondary | ICD-10-CM | POA: Diagnosis not present

## 2013-12-05 DIAGNOSIS — Z86718 Personal history of other venous thrombosis and embolism: Secondary | ICD-10-CM | POA: Diagnosis not present

## 2013-12-05 DIAGNOSIS — I10 Essential (primary) hypertension: Secondary | ICD-10-CM | POA: Diagnosis not present

## 2013-12-05 DIAGNOSIS — I509 Heart failure, unspecified: Secondary | ICD-10-CM | POA: Diagnosis not present

## 2013-12-05 DIAGNOSIS — I69928 Other speech and language deficits following unspecified cerebrovascular disease: Secondary | ICD-10-CM | POA: Diagnosis not present

## 2013-12-05 DIAGNOSIS — I4891 Unspecified atrial fibrillation: Secondary | ICD-10-CM | POA: Diagnosis not present

## 2013-12-06 DIAGNOSIS — I69928 Other speech and language deficits following unspecified cerebrovascular disease: Secondary | ICD-10-CM | POA: Diagnosis not present

## 2013-12-06 DIAGNOSIS — Z86718 Personal history of other venous thrombosis and embolism: Secondary | ICD-10-CM | POA: Diagnosis not present

## 2013-12-06 DIAGNOSIS — Z7901 Long term (current) use of anticoagulants: Secondary | ICD-10-CM | POA: Diagnosis not present

## 2013-12-06 DIAGNOSIS — I4891 Unspecified atrial fibrillation: Secondary | ICD-10-CM | POA: Diagnosis not present

## 2013-12-06 DIAGNOSIS — I509 Heart failure, unspecified: Secondary | ICD-10-CM | POA: Diagnosis not present

## 2013-12-06 DIAGNOSIS — I10 Essential (primary) hypertension: Secondary | ICD-10-CM | POA: Diagnosis not present

## 2013-12-10 DIAGNOSIS — Z7901 Long term (current) use of anticoagulants: Secondary | ICD-10-CM | POA: Diagnosis not present

## 2013-12-10 DIAGNOSIS — Z86718 Personal history of other venous thrombosis and embolism: Secondary | ICD-10-CM | POA: Diagnosis not present

## 2013-12-10 DIAGNOSIS — I509 Heart failure, unspecified: Secondary | ICD-10-CM | POA: Diagnosis not present

## 2013-12-10 DIAGNOSIS — I10 Essential (primary) hypertension: Secondary | ICD-10-CM | POA: Diagnosis not present

## 2013-12-10 DIAGNOSIS — I4891 Unspecified atrial fibrillation: Secondary | ICD-10-CM | POA: Diagnosis not present

## 2013-12-10 DIAGNOSIS — I69928 Other speech and language deficits following unspecified cerebrovascular disease: Secondary | ICD-10-CM | POA: Diagnosis not present

## 2013-12-12 DIAGNOSIS — I69928 Other speech and language deficits following unspecified cerebrovascular disease: Secondary | ICD-10-CM | POA: Diagnosis not present

## 2013-12-12 DIAGNOSIS — Z86718 Personal history of other venous thrombosis and embolism: Secondary | ICD-10-CM | POA: Diagnosis not present

## 2013-12-12 DIAGNOSIS — I509 Heart failure, unspecified: Secondary | ICD-10-CM | POA: Diagnosis not present

## 2013-12-12 DIAGNOSIS — I10 Essential (primary) hypertension: Secondary | ICD-10-CM | POA: Diagnosis not present

## 2013-12-12 DIAGNOSIS — I4891 Unspecified atrial fibrillation: Secondary | ICD-10-CM | POA: Diagnosis not present

## 2013-12-12 DIAGNOSIS — Z7901 Long term (current) use of anticoagulants: Secondary | ICD-10-CM | POA: Diagnosis not present

## 2013-12-13 DIAGNOSIS — I69928 Other speech and language deficits following unspecified cerebrovascular disease: Secondary | ICD-10-CM | POA: Diagnosis not present

## 2013-12-13 DIAGNOSIS — I10 Essential (primary) hypertension: Secondary | ICD-10-CM | POA: Diagnosis not present

## 2013-12-13 DIAGNOSIS — Z86718 Personal history of other venous thrombosis and embolism: Secondary | ICD-10-CM | POA: Diagnosis not present

## 2013-12-13 DIAGNOSIS — I509 Heart failure, unspecified: Secondary | ICD-10-CM | POA: Diagnosis not present

## 2013-12-13 DIAGNOSIS — Z7901 Long term (current) use of anticoagulants: Secondary | ICD-10-CM | POA: Diagnosis not present

## 2013-12-13 DIAGNOSIS — I4891 Unspecified atrial fibrillation: Secondary | ICD-10-CM | POA: Diagnosis not present

## 2013-12-14 DIAGNOSIS — G589 Mononeuropathy, unspecified: Secondary | ICD-10-CM | POA: Diagnosis not present

## 2013-12-14 DIAGNOSIS — I6789 Other cerebrovascular disease: Secondary | ICD-10-CM | POA: Diagnosis not present

## 2013-12-14 DIAGNOSIS — M549 Dorsalgia, unspecified: Secondary | ICD-10-CM | POA: Diagnosis not present

## 2013-12-18 DIAGNOSIS — I10 Essential (primary) hypertension: Secondary | ICD-10-CM | POA: Diagnosis not present

## 2013-12-18 DIAGNOSIS — Z7901 Long term (current) use of anticoagulants: Secondary | ICD-10-CM | POA: Diagnosis not present

## 2013-12-18 DIAGNOSIS — Z86718 Personal history of other venous thrombosis and embolism: Secondary | ICD-10-CM | POA: Diagnosis not present

## 2013-12-18 DIAGNOSIS — I69928 Other speech and language deficits following unspecified cerebrovascular disease: Secondary | ICD-10-CM | POA: Diagnosis not present

## 2013-12-18 DIAGNOSIS — I509 Heart failure, unspecified: Secondary | ICD-10-CM | POA: Diagnosis not present

## 2013-12-18 DIAGNOSIS — I4891 Unspecified atrial fibrillation: Secondary | ICD-10-CM | POA: Diagnosis not present

## 2013-12-19 DIAGNOSIS — Z86718 Personal history of other venous thrombosis and embolism: Secondary | ICD-10-CM | POA: Diagnosis not present

## 2013-12-19 DIAGNOSIS — I4891 Unspecified atrial fibrillation: Secondary | ICD-10-CM | POA: Diagnosis not present

## 2013-12-19 DIAGNOSIS — I69928 Other speech and language deficits following unspecified cerebrovascular disease: Secondary | ICD-10-CM | POA: Diagnosis not present

## 2013-12-19 DIAGNOSIS — Z7901 Long term (current) use of anticoagulants: Secondary | ICD-10-CM | POA: Diagnosis not present

## 2013-12-19 DIAGNOSIS — I10 Essential (primary) hypertension: Secondary | ICD-10-CM | POA: Diagnosis not present

## 2013-12-19 DIAGNOSIS — I509 Heart failure, unspecified: Secondary | ICD-10-CM | POA: Diagnosis not present

## 2013-12-25 DIAGNOSIS — Z86718 Personal history of other venous thrombosis and embolism: Secondary | ICD-10-CM | POA: Diagnosis not present

## 2013-12-25 DIAGNOSIS — Z7901 Long term (current) use of anticoagulants: Secondary | ICD-10-CM | POA: Diagnosis not present

## 2013-12-25 DIAGNOSIS — I4891 Unspecified atrial fibrillation: Secondary | ICD-10-CM | POA: Diagnosis not present

## 2013-12-25 DIAGNOSIS — I10 Essential (primary) hypertension: Secondary | ICD-10-CM | POA: Diagnosis not present

## 2013-12-25 DIAGNOSIS — I509 Heart failure, unspecified: Secondary | ICD-10-CM | POA: Diagnosis not present

## 2013-12-25 DIAGNOSIS — I69928 Other speech and language deficits following unspecified cerebrovascular disease: Secondary | ICD-10-CM | POA: Diagnosis not present

## 2013-12-27 DIAGNOSIS — Z86718 Personal history of other venous thrombosis and embolism: Secondary | ICD-10-CM | POA: Diagnosis not present

## 2013-12-27 DIAGNOSIS — I10 Essential (primary) hypertension: Secondary | ICD-10-CM | POA: Diagnosis not present

## 2013-12-27 DIAGNOSIS — I69928 Other speech and language deficits following unspecified cerebrovascular disease: Secondary | ICD-10-CM | POA: Diagnosis not present

## 2013-12-27 DIAGNOSIS — I509 Heart failure, unspecified: Secondary | ICD-10-CM | POA: Diagnosis not present

## 2013-12-27 DIAGNOSIS — I4891 Unspecified atrial fibrillation: Secondary | ICD-10-CM | POA: Diagnosis not present

## 2013-12-27 DIAGNOSIS — Z7901 Long term (current) use of anticoagulants: Secondary | ICD-10-CM | POA: Diagnosis not present

## 2013-12-31 DIAGNOSIS — Z7901 Long term (current) use of anticoagulants: Secondary | ICD-10-CM | POA: Diagnosis not present

## 2013-12-31 DIAGNOSIS — I509 Heart failure, unspecified: Secondary | ICD-10-CM | POA: Diagnosis not present

## 2013-12-31 DIAGNOSIS — I4891 Unspecified atrial fibrillation: Secondary | ICD-10-CM | POA: Diagnosis not present

## 2013-12-31 DIAGNOSIS — Z86718 Personal history of other venous thrombosis and embolism: Secondary | ICD-10-CM | POA: Diagnosis not present

## 2013-12-31 DIAGNOSIS — I69928 Other speech and language deficits following unspecified cerebrovascular disease: Secondary | ICD-10-CM | POA: Diagnosis not present

## 2013-12-31 DIAGNOSIS — I10 Essential (primary) hypertension: Secondary | ICD-10-CM | POA: Diagnosis not present

## 2014-01-04 DIAGNOSIS — I2510 Atherosclerotic heart disease of native coronary artery without angina pectoris: Secondary | ICD-10-CM | POA: Diagnosis not present

## 2014-01-04 DIAGNOSIS — I428 Other cardiomyopathies: Secondary | ICD-10-CM | POA: Diagnosis not present

## 2014-01-04 DIAGNOSIS — Z95810 Presence of automatic (implantable) cardiac defibrillator: Secondary | ICD-10-CM | POA: Diagnosis not present

## 2014-01-04 DIAGNOSIS — I10 Essential (primary) hypertension: Secondary | ICD-10-CM | POA: Diagnosis not present

## 2014-01-04 DIAGNOSIS — E785 Hyperlipidemia, unspecified: Secondary | ICD-10-CM | POA: Diagnosis not present

## 2014-01-04 DIAGNOSIS — I4891 Unspecified atrial fibrillation: Secondary | ICD-10-CM | POA: Diagnosis not present

## 2014-01-07 DIAGNOSIS — I509 Heart failure, unspecified: Secondary | ICD-10-CM | POA: Diagnosis not present

## 2014-01-07 DIAGNOSIS — I4891 Unspecified atrial fibrillation: Secondary | ICD-10-CM | POA: Diagnosis not present

## 2014-01-07 DIAGNOSIS — I10 Essential (primary) hypertension: Secondary | ICD-10-CM | POA: Diagnosis not present

## 2014-01-07 DIAGNOSIS — I69928 Other speech and language deficits following unspecified cerebrovascular disease: Secondary | ICD-10-CM | POA: Diagnosis not present

## 2014-01-07 DIAGNOSIS — Z86718 Personal history of other venous thrombosis and embolism: Secondary | ICD-10-CM | POA: Diagnosis not present

## 2014-01-07 DIAGNOSIS — Z7901 Long term (current) use of anticoagulants: Secondary | ICD-10-CM | POA: Diagnosis not present

## 2014-01-11 DIAGNOSIS — F17200 Nicotine dependence, unspecified, uncomplicated: Secondary | ICD-10-CM | POA: Diagnosis not present

## 2014-01-11 DIAGNOSIS — M549 Dorsalgia, unspecified: Secondary | ICD-10-CM | POA: Diagnosis not present

## 2014-01-11 DIAGNOSIS — I4891 Unspecified atrial fibrillation: Secondary | ICD-10-CM | POA: Diagnosis not present

## 2014-01-11 DIAGNOSIS — G589 Mononeuropathy, unspecified: Secondary | ICD-10-CM | POA: Diagnosis not present

## 2014-01-11 DIAGNOSIS — Z79899 Other long term (current) drug therapy: Secondary | ICD-10-CM | POA: Diagnosis not present

## 2014-01-14 DIAGNOSIS — Z7901 Long term (current) use of anticoagulants: Secondary | ICD-10-CM | POA: Diagnosis not present

## 2014-01-14 DIAGNOSIS — Z86718 Personal history of other venous thrombosis and embolism: Secondary | ICD-10-CM | POA: Diagnosis not present

## 2014-01-14 DIAGNOSIS — I69928 Other speech and language deficits following unspecified cerebrovascular disease: Secondary | ICD-10-CM | POA: Diagnosis not present

## 2014-01-14 DIAGNOSIS — I10 Essential (primary) hypertension: Secondary | ICD-10-CM | POA: Diagnosis not present

## 2014-01-14 DIAGNOSIS — I509 Heart failure, unspecified: Secondary | ICD-10-CM | POA: Diagnosis not present

## 2014-01-14 DIAGNOSIS — I4891 Unspecified atrial fibrillation: Secondary | ICD-10-CM | POA: Diagnosis not present

## 2014-01-21 DIAGNOSIS — I10 Essential (primary) hypertension: Secondary | ICD-10-CM | POA: Diagnosis not present

## 2014-01-21 DIAGNOSIS — Z7901 Long term (current) use of anticoagulants: Secondary | ICD-10-CM | POA: Diagnosis not present

## 2014-01-21 DIAGNOSIS — I69928 Other speech and language deficits following unspecified cerebrovascular disease: Secondary | ICD-10-CM | POA: Diagnosis not present

## 2014-01-21 DIAGNOSIS — I4891 Unspecified atrial fibrillation: Secondary | ICD-10-CM | POA: Diagnosis not present

## 2014-01-21 DIAGNOSIS — Z86718 Personal history of other venous thrombosis and embolism: Secondary | ICD-10-CM | POA: Diagnosis not present

## 2014-01-21 DIAGNOSIS — I509 Heart failure, unspecified: Secondary | ICD-10-CM | POA: Diagnosis not present

## 2014-01-29 DIAGNOSIS — I4891 Unspecified atrial fibrillation: Secondary | ICD-10-CM | POA: Diagnosis not present

## 2014-01-29 DIAGNOSIS — I69928 Other speech and language deficits following unspecified cerebrovascular disease: Secondary | ICD-10-CM | POA: Diagnosis not present

## 2014-01-29 DIAGNOSIS — Z7901 Long term (current) use of anticoagulants: Secondary | ICD-10-CM | POA: Diagnosis not present

## 2014-01-29 DIAGNOSIS — I10 Essential (primary) hypertension: Secondary | ICD-10-CM | POA: Diagnosis not present

## 2014-01-29 DIAGNOSIS — I509 Heart failure, unspecified: Secondary | ICD-10-CM | POA: Diagnosis not present

## 2014-01-29 DIAGNOSIS — Z86718 Personal history of other venous thrombosis and embolism: Secondary | ICD-10-CM | POA: Diagnosis not present

## 2014-02-11 DIAGNOSIS — G894 Chronic pain syndrome: Secondary | ICD-10-CM | POA: Diagnosis not present

## 2014-05-10 DIAGNOSIS — Z79899 Other long term (current) drug therapy: Secondary | ICD-10-CM | POA: Diagnosis not present

## 2014-05-10 DIAGNOSIS — I4891 Unspecified atrial fibrillation: Secondary | ICD-10-CM | POA: Diagnosis not present

## 2014-05-10 DIAGNOSIS — G8929 Other chronic pain: Secondary | ICD-10-CM | POA: Diagnosis not present

## 2014-06-04 DIAGNOSIS — I639 Cerebral infarction, unspecified: Secondary | ICD-10-CM | POA: Diagnosis not present

## 2014-06-10 DIAGNOSIS — Z79899 Other long term (current) drug therapy: Secondary | ICD-10-CM | POA: Diagnosis not present

## 2014-06-10 DIAGNOSIS — I10 Essential (primary) hypertension: Secondary | ICD-10-CM | POA: Diagnosis not present

## 2014-06-10 DIAGNOSIS — I4891 Unspecified atrial fibrillation: Secondary | ICD-10-CM | POA: Diagnosis not present

## 2014-06-10 DIAGNOSIS — E785 Hyperlipidemia, unspecified: Secondary | ICD-10-CM | POA: Diagnosis not present

## 2014-06-10 DIAGNOSIS — G8929 Other chronic pain: Secondary | ICD-10-CM | POA: Diagnosis not present

## 2014-06-10 DIAGNOSIS — N400 Enlarged prostate without lower urinary tract symptoms: Secondary | ICD-10-CM | POA: Diagnosis not present

## 2014-06-26 DIAGNOSIS — I4891 Unspecified atrial fibrillation: Secondary | ICD-10-CM | POA: Diagnosis not present

## 2014-07-03 DIAGNOSIS — I4891 Unspecified atrial fibrillation: Secondary | ICD-10-CM | POA: Diagnosis not present

## 2014-07-10 DIAGNOSIS — I4891 Unspecified atrial fibrillation: Secondary | ICD-10-CM | POA: Diagnosis not present

## 2014-07-10 DIAGNOSIS — G8929 Other chronic pain: Secondary | ICD-10-CM | POA: Diagnosis not present

## 2014-07-10 DIAGNOSIS — Z79899 Other long term (current) drug therapy: Secondary | ICD-10-CM | POA: Diagnosis not present

## 2014-07-10 DIAGNOSIS — I10 Essential (primary) hypertension: Secondary | ICD-10-CM | POA: Diagnosis not present

## 2014-07-10 DIAGNOSIS — Z87891 Personal history of nicotine dependence: Secondary | ICD-10-CM | POA: Diagnosis not present

## 2014-07-10 DIAGNOSIS — Z720 Tobacco use: Secondary | ICD-10-CM | POA: Diagnosis not present

## 2014-08-12 DIAGNOSIS — G8929 Other chronic pain: Secondary | ICD-10-CM | POA: Diagnosis not present

## 2014-08-12 DIAGNOSIS — Z79899 Other long term (current) drug therapy: Secondary | ICD-10-CM | POA: Diagnosis not present

## 2014-08-12 DIAGNOSIS — J329 Chronic sinusitis, unspecified: Secondary | ICD-10-CM | POA: Diagnosis not present

## 2014-09-09 DIAGNOSIS — G8929 Other chronic pain: Secondary | ICD-10-CM | POA: Diagnosis not present

## 2014-09-09 DIAGNOSIS — Z79899 Other long term (current) drug therapy: Secondary | ICD-10-CM | POA: Diagnosis not present

## 2014-09-09 DIAGNOSIS — I10 Essential (primary) hypertension: Secondary | ICD-10-CM | POA: Diagnosis not present

## 2014-09-09 DIAGNOSIS — I429 Cardiomyopathy, unspecified: Secondary | ICD-10-CM | POA: Diagnosis not present

## 2014-09-16 DIAGNOSIS — D72829 Elevated white blood cell count, unspecified: Secondary | ICD-10-CM | POA: Diagnosis not present

## 2014-09-16 DIAGNOSIS — R197 Diarrhea, unspecified: Secondary | ICD-10-CM | POA: Diagnosis not present

## 2014-09-16 DIAGNOSIS — R110 Nausea: Secondary | ICD-10-CM | POA: Diagnosis not present

## 2014-09-16 DIAGNOSIS — R109 Unspecified abdominal pain: Secondary | ICD-10-CM | POA: Diagnosis not present

## 2014-10-14 DIAGNOSIS — I10 Essential (primary) hypertension: Secondary | ICD-10-CM | POA: Diagnosis not present

## 2014-10-14 DIAGNOSIS — I429 Cardiomyopathy, unspecified: Secondary | ICD-10-CM | POA: Diagnosis not present

## 2014-10-14 DIAGNOSIS — G8929 Other chronic pain: Secondary | ICD-10-CM | POA: Diagnosis not present

## 2014-10-14 DIAGNOSIS — Z79899 Other long term (current) drug therapy: Secondary | ICD-10-CM | POA: Diagnosis not present

## 2014-10-14 DIAGNOSIS — I4891 Unspecified atrial fibrillation: Secondary | ICD-10-CM | POA: Diagnosis not present

## 2014-10-14 DIAGNOSIS — Z87891 Personal history of nicotine dependence: Secondary | ICD-10-CM | POA: Diagnosis not present

## 2014-11-13 DIAGNOSIS — G8929 Other chronic pain: Secondary | ICD-10-CM | POA: Diagnosis not present

## 2014-11-13 DIAGNOSIS — Z720 Tobacco use: Secondary | ICD-10-CM | POA: Diagnosis not present

## 2014-11-13 DIAGNOSIS — G4700 Insomnia, unspecified: Secondary | ICD-10-CM | POA: Diagnosis not present

## 2014-11-13 DIAGNOSIS — I10 Essential (primary) hypertension: Secondary | ICD-10-CM | POA: Diagnosis not present

## 2014-11-13 DIAGNOSIS — Z79899 Other long term (current) drug therapy: Secondary | ICD-10-CM | POA: Diagnosis not present

## 2014-12-05 DIAGNOSIS — Z4502 Encounter for adjustment and management of automatic implantable cardiac defibrillator: Secondary | ICD-10-CM | POA: Diagnosis not present

## 2014-12-05 DIAGNOSIS — I442 Atrioventricular block, complete: Secondary | ICD-10-CM | POA: Insufficient documentation

## 2014-12-05 DIAGNOSIS — Z95810 Presence of automatic (implantable) cardiac defibrillator: Secondary | ICD-10-CM | POA: Insufficient documentation

## 2014-12-05 DIAGNOSIS — I252 Old myocardial infarction: Secondary | ICD-10-CM | POA: Insufficient documentation

## 2014-12-16 DIAGNOSIS — Z79899 Other long term (current) drug therapy: Secondary | ICD-10-CM | POA: Diagnosis not present

## 2014-12-16 DIAGNOSIS — G4700 Insomnia, unspecified: Secondary | ICD-10-CM | POA: Diagnosis not present

## 2014-12-16 DIAGNOSIS — Z720 Tobacco use: Secondary | ICD-10-CM | POA: Diagnosis not present

## 2014-12-16 DIAGNOSIS — I10 Essential (primary) hypertension: Secondary | ICD-10-CM | POA: Diagnosis not present

## 2014-12-16 DIAGNOSIS — J40 Bronchitis, not specified as acute or chronic: Secondary | ICD-10-CM | POA: Diagnosis not present

## 2014-12-16 DIAGNOSIS — G8929 Other chronic pain: Secondary | ICD-10-CM | POA: Diagnosis not present

## 2014-12-25 DIAGNOSIS — Z7901 Long term (current) use of anticoagulants: Secondary | ICD-10-CM | POA: Diagnosis not present

## 2014-12-25 DIAGNOSIS — K5900 Constipation, unspecified: Secondary | ICD-10-CM | POA: Diagnosis not present

## 2014-12-25 DIAGNOSIS — K921 Melena: Secondary | ICD-10-CM | POA: Diagnosis not present

## 2015-01-15 DIAGNOSIS — F322 Major depressive disorder, single episode, severe without psychotic features: Secondary | ICD-10-CM | POA: Diagnosis not present

## 2015-01-15 DIAGNOSIS — Z1212 Encounter for screening for malignant neoplasm of rectum: Secondary | ICD-10-CM | POA: Diagnosis not present

## 2015-01-15 DIAGNOSIS — I10 Essential (primary) hypertension: Secondary | ICD-10-CM | POA: Diagnosis not present

## 2015-01-15 DIAGNOSIS — Z79899 Other long term (current) drug therapy: Secondary | ICD-10-CM | POA: Diagnosis not present

## 2015-01-15 DIAGNOSIS — G629 Polyneuropathy, unspecified: Secondary | ICD-10-CM | POA: Diagnosis not present

## 2015-01-15 DIAGNOSIS — G8929 Other chronic pain: Secondary | ICD-10-CM | POA: Diagnosis not present

## 2015-01-15 DIAGNOSIS — Z1389 Encounter for screening for other disorder: Secondary | ICD-10-CM | POA: Diagnosis not present

## 2015-02-17 DIAGNOSIS — I4891 Unspecified atrial fibrillation: Secondary | ICD-10-CM | POA: Diagnosis not present

## 2015-02-17 DIAGNOSIS — G8929 Other chronic pain: Secondary | ICD-10-CM | POA: Diagnosis not present

## 2015-02-17 DIAGNOSIS — I10 Essential (primary) hypertension: Secondary | ICD-10-CM | POA: Diagnosis not present

## 2015-02-17 DIAGNOSIS — Z79899 Other long term (current) drug therapy: Secondary | ICD-10-CM | POA: Diagnosis not present

## 2015-03-18 DIAGNOSIS — I4891 Unspecified atrial fibrillation: Secondary | ICD-10-CM | POA: Diagnosis not present

## 2015-03-18 DIAGNOSIS — G8929 Other chronic pain: Secondary | ICD-10-CM | POA: Diagnosis not present

## 2015-03-18 DIAGNOSIS — G629 Polyneuropathy, unspecified: Secondary | ICD-10-CM | POA: Diagnosis not present

## 2015-03-18 DIAGNOSIS — F322 Major depressive disorder, single episode, severe without psychotic features: Secondary | ICD-10-CM | POA: Diagnosis not present

## 2015-03-18 DIAGNOSIS — I10 Essential (primary) hypertension: Secondary | ICD-10-CM | POA: Diagnosis not present

## 2015-03-18 DIAGNOSIS — Z79899 Other long term (current) drug therapy: Secondary | ICD-10-CM | POA: Diagnosis not present

## 2015-04-16 DIAGNOSIS — G8929 Other chronic pain: Secondary | ICD-10-CM | POA: Diagnosis not present

## 2015-04-16 DIAGNOSIS — G629 Polyneuropathy, unspecified: Secondary | ICD-10-CM | POA: Diagnosis not present

## 2015-04-16 DIAGNOSIS — Z79899 Other long term (current) drug therapy: Secondary | ICD-10-CM | POA: Diagnosis not present

## 2015-04-16 DIAGNOSIS — I4891 Unspecified atrial fibrillation: Secondary | ICD-10-CM | POA: Diagnosis not present

## 2015-04-16 DIAGNOSIS — I10 Essential (primary) hypertension: Secondary | ICD-10-CM | POA: Diagnosis not present

## 2015-05-13 DIAGNOSIS — M549 Dorsalgia, unspecified: Secondary | ICD-10-CM | POA: Diagnosis not present

## 2015-05-13 DIAGNOSIS — E785 Hyperlipidemia, unspecified: Secondary | ICD-10-CM | POA: Diagnosis not present

## 2015-05-13 DIAGNOSIS — Z79899 Other long term (current) drug therapy: Secondary | ICD-10-CM | POA: Diagnosis not present

## 2015-05-13 DIAGNOSIS — I2510 Atherosclerotic heart disease of native coronary artery without angina pectoris: Secondary | ICD-10-CM | POA: Diagnosis not present

## 2015-06-13 DIAGNOSIS — Z79899 Other long term (current) drug therapy: Secondary | ICD-10-CM | POA: Diagnosis not present

## 2015-06-13 DIAGNOSIS — I2510 Atherosclerotic heart disease of native coronary artery without angina pectoris: Secondary | ICD-10-CM | POA: Diagnosis not present

## 2015-06-13 DIAGNOSIS — M549 Dorsalgia, unspecified: Secondary | ICD-10-CM | POA: Diagnosis not present

## 2015-07-14 DIAGNOSIS — I2510 Atherosclerotic heart disease of native coronary artery without angina pectoris: Secondary | ICD-10-CM | POA: Diagnosis not present

## 2015-07-14 DIAGNOSIS — M549 Dorsalgia, unspecified: Secondary | ICD-10-CM | POA: Diagnosis not present

## 2015-08-11 DIAGNOSIS — M549 Dorsalgia, unspecified: Secondary | ICD-10-CM | POA: Diagnosis not present

## 2015-08-11 DIAGNOSIS — M25552 Pain in left hip: Secondary | ICD-10-CM | POA: Diagnosis not present

## 2015-08-11 DIAGNOSIS — Z5181 Encounter for therapeutic drug level monitoring: Secondary | ICD-10-CM | POA: Diagnosis not present

## 2015-08-11 DIAGNOSIS — I2510 Atherosclerotic heart disease of native coronary artery without angina pectoris: Secondary | ICD-10-CM | POA: Diagnosis not present

## 2015-08-11 DIAGNOSIS — Z79899 Other long term (current) drug therapy: Secondary | ICD-10-CM | POA: Diagnosis not present

## 2015-09-10 DIAGNOSIS — M549 Dorsalgia, unspecified: Secondary | ICD-10-CM | POA: Diagnosis not present

## 2015-09-10 DIAGNOSIS — Z7901 Long term (current) use of anticoagulants: Secondary | ICD-10-CM | POA: Diagnosis not present

## 2015-09-10 DIAGNOSIS — I2510 Atherosclerotic heart disease of native coronary artery without angina pectoris: Secondary | ICD-10-CM | POA: Diagnosis not present

## 2015-09-10 DIAGNOSIS — M25552 Pain in left hip: Secondary | ICD-10-CM | POA: Diagnosis not present

## 2015-10-10 DIAGNOSIS — Z7901 Long term (current) use of anticoagulants: Secondary | ICD-10-CM | POA: Diagnosis not present

## 2015-10-10 DIAGNOSIS — Z79899 Other long term (current) drug therapy: Secondary | ICD-10-CM | POA: Diagnosis not present

## 2015-10-10 DIAGNOSIS — M25552 Pain in left hip: Secondary | ICD-10-CM | POA: Diagnosis not present

## 2015-10-10 DIAGNOSIS — G8929 Other chronic pain: Secondary | ICD-10-CM | POA: Diagnosis not present

## 2015-10-10 DIAGNOSIS — I2510 Atherosclerotic heart disease of native coronary artery without angina pectoris: Secondary | ICD-10-CM | POA: Diagnosis not present

## 2015-10-10 DIAGNOSIS — Z5181 Encounter for therapeutic drug level monitoring: Secondary | ICD-10-CM | POA: Diagnosis not present

## 2015-10-10 DIAGNOSIS — M5442 Lumbago with sciatica, left side: Secondary | ICD-10-CM | POA: Diagnosis not present

## 2015-11-07 DIAGNOSIS — K047 Periapical abscess without sinus: Secondary | ICD-10-CM | POA: Diagnosis not present

## 2015-11-07 DIAGNOSIS — G8929 Other chronic pain: Secondary | ICD-10-CM | POA: Diagnosis not present

## 2015-11-07 DIAGNOSIS — F321 Major depressive disorder, single episode, moderate: Secondary | ICD-10-CM | POA: Diagnosis not present

## 2015-11-07 DIAGNOSIS — M5442 Lumbago with sciatica, left side: Secondary | ICD-10-CM | POA: Diagnosis not present

## 2015-11-07 DIAGNOSIS — I2510 Atherosclerotic heart disease of native coronary artery without angina pectoris: Secondary | ICD-10-CM | POA: Diagnosis not present

## 2015-11-07 DIAGNOSIS — Z79899 Other long term (current) drug therapy: Secondary | ICD-10-CM | POA: Diagnosis not present

## 2015-11-07 DIAGNOSIS — Z5181 Encounter for therapeutic drug level monitoring: Secondary | ICD-10-CM | POA: Diagnosis not present

## 2015-12-08 DIAGNOSIS — M5442 Lumbago with sciatica, left side: Secondary | ICD-10-CM | POA: Diagnosis not present

## 2015-12-08 DIAGNOSIS — G8929 Other chronic pain: Secondary | ICD-10-CM | POA: Diagnosis not present

## 2015-12-08 DIAGNOSIS — Z79899 Other long term (current) drug therapy: Secondary | ICD-10-CM | POA: Diagnosis not present

## 2015-12-08 DIAGNOSIS — Z5181 Encounter for therapeutic drug level monitoring: Secondary | ICD-10-CM | POA: Diagnosis not present

## 2015-12-08 DIAGNOSIS — I2510 Atherosclerotic heart disease of native coronary artery without angina pectoris: Secondary | ICD-10-CM | POA: Diagnosis not present

## 2015-12-08 DIAGNOSIS — F321 Major depressive disorder, single episode, moderate: Secondary | ICD-10-CM | POA: Diagnosis not present

## 2016-01-09 DIAGNOSIS — I2510 Atherosclerotic heart disease of native coronary artery without angina pectoris: Secondary | ICD-10-CM | POA: Diagnosis not present

## 2016-01-09 DIAGNOSIS — G4700 Insomnia, unspecified: Secondary | ICD-10-CM | POA: Diagnosis not present

## 2016-01-09 DIAGNOSIS — M5442 Lumbago with sciatica, left side: Secondary | ICD-10-CM | POA: Diagnosis not present

## 2016-01-09 DIAGNOSIS — Z6828 Body mass index (BMI) 28.0-28.9, adult: Secondary | ICD-10-CM | POA: Diagnosis not present

## 2016-01-09 DIAGNOSIS — F321 Major depressive disorder, single episode, moderate: Secondary | ICD-10-CM | POA: Diagnosis not present

## 2016-01-09 DIAGNOSIS — Z23 Encounter for immunization: Secondary | ICD-10-CM | POA: Diagnosis not present

## 2016-01-09 DIAGNOSIS — Z79899 Other long term (current) drug therapy: Secondary | ICD-10-CM | POA: Diagnosis not present

## 2016-01-09 DIAGNOSIS — Z5181 Encounter for therapeutic drug level monitoring: Secondary | ICD-10-CM | POA: Diagnosis not present

## 2016-01-09 DIAGNOSIS — G8929 Other chronic pain: Secondary | ICD-10-CM | POA: Diagnosis not present

## 2016-01-09 DIAGNOSIS — Z1389 Encounter for screening for other disorder: Secondary | ICD-10-CM | POA: Diagnosis not present

## 2016-01-30 DIAGNOSIS — E785 Hyperlipidemia, unspecified: Secondary | ICD-10-CM | POA: Diagnosis not present

## 2016-01-30 DIAGNOSIS — Z1212 Encounter for screening for malignant neoplasm of rectum: Secondary | ICD-10-CM | POA: Diagnosis not present

## 2016-01-30 DIAGNOSIS — N400 Enlarged prostate without lower urinary tract symptoms: Secondary | ICD-10-CM | POA: Diagnosis not present

## 2016-01-30 DIAGNOSIS — I2510 Atherosclerotic heart disease of native coronary artery without angina pectoris: Secondary | ICD-10-CM | POA: Diagnosis not present

## 2016-02-04 DIAGNOSIS — N400 Enlarged prostate without lower urinary tract symptoms: Secondary | ICD-10-CM | POA: Diagnosis not present

## 2016-02-04 DIAGNOSIS — Z0000 Encounter for general adult medical examination without abnormal findings: Secondary | ICD-10-CM | POA: Diagnosis not present

## 2016-02-04 DIAGNOSIS — Z5181 Encounter for therapeutic drug level monitoring: Secondary | ICD-10-CM | POA: Diagnosis not present

## 2016-02-09 DIAGNOSIS — D696 Thrombocytopenia, unspecified: Secondary | ICD-10-CM | POA: Diagnosis not present

## 2016-02-09 DIAGNOSIS — Z79899 Other long term (current) drug therapy: Secondary | ICD-10-CM | POA: Diagnosis not present

## 2016-02-09 DIAGNOSIS — M5442 Lumbago with sciatica, left side: Secondary | ICD-10-CM | POA: Diagnosis not present

## 2016-02-09 DIAGNOSIS — Z6827 Body mass index (BMI) 27.0-27.9, adult: Secondary | ICD-10-CM | POA: Diagnosis not present

## 2016-02-09 DIAGNOSIS — F331 Major depressive disorder, recurrent, moderate: Secondary | ICD-10-CM | POA: Diagnosis not present

## 2016-02-09 DIAGNOSIS — Z5181 Encounter for therapeutic drug level monitoring: Secondary | ICD-10-CM | POA: Diagnosis not present

## 2016-02-24 DIAGNOSIS — D696 Thrombocytopenia, unspecified: Secondary | ICD-10-CM | POA: Diagnosis not present

## 2016-02-24 DIAGNOSIS — Z5181 Encounter for therapeutic drug level monitoring: Secondary | ICD-10-CM | POA: Diagnosis not present

## 2016-02-24 DIAGNOSIS — M5442 Lumbago with sciatica, left side: Secondary | ICD-10-CM | POA: Diagnosis not present

## 2016-02-24 DIAGNOSIS — Z6827 Body mass index (BMI) 27.0-27.9, adult: Secondary | ICD-10-CM | POA: Diagnosis not present

## 2016-02-24 DIAGNOSIS — Z79899 Other long term (current) drug therapy: Secondary | ICD-10-CM | POA: Diagnosis not present

## 2016-02-24 DIAGNOSIS — K047 Periapical abscess without sinus: Secondary | ICD-10-CM | POA: Diagnosis not present

## 2016-02-24 DIAGNOSIS — D72829 Elevated white blood cell count, unspecified: Secondary | ICD-10-CM | POA: Diagnosis not present

## 2016-03-08 DIAGNOSIS — G8929 Other chronic pain: Secondary | ICD-10-CM | POA: Diagnosis not present

## 2016-03-08 DIAGNOSIS — I2510 Atherosclerotic heart disease of native coronary artery without angina pectoris: Secondary | ICD-10-CM | POA: Diagnosis not present

## 2016-03-08 DIAGNOSIS — Z6828 Body mass index (BMI) 28.0-28.9, adult: Secondary | ICD-10-CM | POA: Diagnosis not present

## 2016-03-08 DIAGNOSIS — I10 Essential (primary) hypertension: Secondary | ICD-10-CM | POA: Diagnosis not present

## 2016-03-08 DIAGNOSIS — Z79899 Other long term (current) drug therapy: Secondary | ICD-10-CM | POA: Diagnosis not present

## 2016-03-08 DIAGNOSIS — Z5181 Encounter for therapeutic drug level monitoring: Secondary | ICD-10-CM | POA: Diagnosis not present

## 2016-03-08 DIAGNOSIS — Z720 Tobacco use: Secondary | ICD-10-CM | POA: Diagnosis not present

## 2016-03-08 DIAGNOSIS — I4891 Unspecified atrial fibrillation: Secondary | ICD-10-CM | POA: Diagnosis not present

## 2016-03-08 DIAGNOSIS — M549 Dorsalgia, unspecified: Secondary | ICD-10-CM | POA: Diagnosis not present

## 2016-04-02 DIAGNOSIS — I4891 Unspecified atrial fibrillation: Secondary | ICD-10-CM | POA: Diagnosis not present

## 2016-04-08 DIAGNOSIS — I43 Cardiomyopathy in diseases classified elsewhere: Secondary | ICD-10-CM | POA: Diagnosis not present

## 2016-04-08 DIAGNOSIS — I2510 Atherosclerotic heart disease of native coronary artery without angina pectoris: Secondary | ICD-10-CM | POA: Diagnosis not present

## 2016-04-08 DIAGNOSIS — Z79899 Other long term (current) drug therapy: Secondary | ICD-10-CM | POA: Diagnosis not present

## 2016-04-08 DIAGNOSIS — I4891 Unspecified atrial fibrillation: Secondary | ICD-10-CM | POA: Diagnosis not present

## 2016-04-08 DIAGNOSIS — Z5181 Encounter for therapeutic drug level monitoring: Secondary | ICD-10-CM | POA: Diagnosis not present

## 2016-04-08 DIAGNOSIS — G8929 Other chronic pain: Secondary | ICD-10-CM | POA: Diagnosis not present

## 2016-04-08 DIAGNOSIS — I10 Essential (primary) hypertension: Secondary | ICD-10-CM | POA: Diagnosis not present

## 2016-04-08 DIAGNOSIS — M549 Dorsalgia, unspecified: Secondary | ICD-10-CM | POA: Diagnosis not present

## 2016-05-07 DIAGNOSIS — Z5181 Encounter for therapeutic drug level monitoring: Secondary | ICD-10-CM | POA: Diagnosis not present

## 2016-05-07 DIAGNOSIS — I10 Essential (primary) hypertension: Secondary | ICD-10-CM | POA: Diagnosis not present

## 2016-05-07 DIAGNOSIS — Z79899 Other long term (current) drug therapy: Secondary | ICD-10-CM | POA: Diagnosis not present

## 2016-05-07 DIAGNOSIS — I4891 Unspecified atrial fibrillation: Secondary | ICD-10-CM | POA: Diagnosis not present

## 2016-05-07 DIAGNOSIS — M545 Low back pain: Secondary | ICD-10-CM | POA: Diagnosis not present

## 2016-06-07 DIAGNOSIS — I4891 Unspecified atrial fibrillation: Secondary | ICD-10-CM | POA: Diagnosis not present

## 2016-06-07 DIAGNOSIS — Z5181 Encounter for therapeutic drug level monitoring: Secondary | ICD-10-CM | POA: Diagnosis not present

## 2016-06-07 DIAGNOSIS — Z79899 Other long term (current) drug therapy: Secondary | ICD-10-CM | POA: Diagnosis not present

## 2016-06-07 DIAGNOSIS — I10 Essential (primary) hypertension: Secondary | ICD-10-CM | POA: Diagnosis not present

## 2016-06-07 DIAGNOSIS — I43 Cardiomyopathy in diseases classified elsewhere: Secondary | ICD-10-CM | POA: Diagnosis not present

## 2016-06-07 DIAGNOSIS — M549 Dorsalgia, unspecified: Secondary | ICD-10-CM | POA: Diagnosis not present

## 2016-06-07 DIAGNOSIS — G8929 Other chronic pain: Secondary | ICD-10-CM | POA: Diagnosis not present

## 2016-07-07 DIAGNOSIS — M5442 Lumbago with sciatica, left side: Secondary | ICD-10-CM | POA: Diagnosis not present

## 2016-07-07 DIAGNOSIS — Z79899 Other long term (current) drug therapy: Secondary | ICD-10-CM | POA: Diagnosis not present

## 2016-07-07 DIAGNOSIS — I10 Essential (primary) hypertension: Secondary | ICD-10-CM | POA: Diagnosis not present

## 2016-07-07 DIAGNOSIS — Z5181 Encounter for therapeutic drug level monitoring: Secondary | ICD-10-CM | POA: Diagnosis not present

## 2016-07-07 DIAGNOSIS — I2510 Atherosclerotic heart disease of native coronary artery without angina pectoris: Secondary | ICD-10-CM | POA: Diagnosis not present

## 2016-07-07 DIAGNOSIS — K649 Unspecified hemorrhoids: Secondary | ICD-10-CM | POA: Diagnosis not present

## 2016-08-04 DIAGNOSIS — I482 Chronic atrial fibrillation: Secondary | ICD-10-CM | POA: Diagnosis not present

## 2016-08-04 DIAGNOSIS — Z79899 Other long term (current) drug therapy: Secondary | ICD-10-CM | POA: Diagnosis not present

## 2016-08-04 DIAGNOSIS — M5442 Lumbago with sciatica, left side: Secondary | ICD-10-CM | POA: Diagnosis not present

## 2016-08-04 DIAGNOSIS — I10 Essential (primary) hypertension: Secondary | ICD-10-CM | POA: Diagnosis not present

## 2016-08-04 DIAGNOSIS — G8929 Other chronic pain: Secondary | ICD-10-CM | POA: Diagnosis not present

## 2016-08-04 DIAGNOSIS — Z5181 Encounter for therapeutic drug level monitoring: Secondary | ICD-10-CM | POA: Diagnosis not present

## 2016-09-06 DIAGNOSIS — M5442 Lumbago with sciatica, left side: Secondary | ICD-10-CM | POA: Diagnosis not present

## 2016-09-06 DIAGNOSIS — Z5181 Encounter for therapeutic drug level monitoring: Secondary | ICD-10-CM | POA: Diagnosis not present

## 2016-09-06 DIAGNOSIS — J3089 Other allergic rhinitis: Secondary | ICD-10-CM | POA: Diagnosis not present

## 2016-09-06 DIAGNOSIS — I4891 Unspecified atrial fibrillation: Secondary | ICD-10-CM | POA: Diagnosis not present

## 2016-09-06 DIAGNOSIS — Z79899 Other long term (current) drug therapy: Secondary | ICD-10-CM | POA: Diagnosis not present

## 2016-09-06 DIAGNOSIS — I2510 Atherosclerotic heart disease of native coronary artery without angina pectoris: Secondary | ICD-10-CM | POA: Diagnosis not present

## 2016-09-06 DIAGNOSIS — G8929 Other chronic pain: Secondary | ICD-10-CM | POA: Diagnosis not present

## 2016-10-06 DIAGNOSIS — Z5181 Encounter for therapeutic drug level monitoring: Secondary | ICD-10-CM | POA: Diagnosis not present

## 2016-10-06 DIAGNOSIS — Z79899 Other long term (current) drug therapy: Secondary | ICD-10-CM | POA: Diagnosis not present

## 2016-10-06 DIAGNOSIS — I4891 Unspecified atrial fibrillation: Secondary | ICD-10-CM | POA: Diagnosis not present

## 2016-10-06 DIAGNOSIS — M5442 Lumbago with sciatica, left side: Secondary | ICD-10-CM | POA: Diagnosis not present

## 2016-10-06 DIAGNOSIS — G8929 Other chronic pain: Secondary | ICD-10-CM | POA: Diagnosis not present

## 2016-10-06 DIAGNOSIS — I2510 Atherosclerotic heart disease of native coronary artery without angina pectoris: Secondary | ICD-10-CM | POA: Diagnosis not present

## 2016-11-05 DIAGNOSIS — G8929 Other chronic pain: Secondary | ICD-10-CM | POA: Diagnosis not present

## 2016-11-05 DIAGNOSIS — I2510 Atherosclerotic heart disease of native coronary artery without angina pectoris: Secondary | ICD-10-CM | POA: Diagnosis not present

## 2016-11-05 DIAGNOSIS — Z5181 Encounter for therapeutic drug level monitoring: Secondary | ICD-10-CM | POA: Diagnosis not present

## 2016-11-05 DIAGNOSIS — M5442 Lumbago with sciatica, left side: Secondary | ICD-10-CM | POA: Diagnosis not present

## 2016-11-05 DIAGNOSIS — Z79899 Other long term (current) drug therapy: Secondary | ICD-10-CM | POA: Diagnosis not present

## 2016-12-06 DIAGNOSIS — Z5181 Encounter for therapeutic drug level monitoring: Secondary | ICD-10-CM | POA: Diagnosis not present

## 2016-12-06 DIAGNOSIS — G8929 Other chronic pain: Secondary | ICD-10-CM | POA: Diagnosis not present

## 2016-12-06 DIAGNOSIS — F482 Pseudobulbar affect: Secondary | ICD-10-CM | POA: Diagnosis not present

## 2016-12-06 DIAGNOSIS — M5442 Lumbago with sciatica, left side: Secondary | ICD-10-CM | POA: Diagnosis not present

## 2016-12-06 DIAGNOSIS — I2510 Atherosclerotic heart disease of native coronary artery without angina pectoris: Secondary | ICD-10-CM | POA: Diagnosis not present

## 2016-12-06 DIAGNOSIS — Z79899 Other long term (current) drug therapy: Secondary | ICD-10-CM | POA: Diagnosis not present

## 2017-01-07 DIAGNOSIS — M5442 Lumbago with sciatica, left side: Secondary | ICD-10-CM | POA: Diagnosis not present

## 2017-01-07 DIAGNOSIS — I2510 Atherosclerotic heart disease of native coronary artery without angina pectoris: Secondary | ICD-10-CM | POA: Diagnosis not present

## 2017-01-07 DIAGNOSIS — F482 Pseudobulbar affect: Secondary | ICD-10-CM | POA: Diagnosis not present

## 2017-01-07 DIAGNOSIS — K5903 Drug induced constipation: Secondary | ICD-10-CM | POA: Diagnosis not present

## 2017-01-07 DIAGNOSIS — Z5181 Encounter for therapeutic drug level monitoring: Secondary | ICD-10-CM | POA: Diagnosis not present

## 2017-01-07 DIAGNOSIS — G8929 Other chronic pain: Secondary | ICD-10-CM | POA: Diagnosis not present

## 2017-01-07 DIAGNOSIS — Z79899 Other long term (current) drug therapy: Secondary | ICD-10-CM | POA: Diagnosis not present

## 2017-02-07 DIAGNOSIS — K5903 Drug induced constipation: Secondary | ICD-10-CM | POA: Diagnosis not present

## 2017-02-07 DIAGNOSIS — I2510 Atherosclerotic heart disease of native coronary artery without angina pectoris: Secondary | ICD-10-CM | POA: Diagnosis not present

## 2017-02-07 DIAGNOSIS — G8929 Other chronic pain: Secondary | ICD-10-CM | POA: Diagnosis not present

## 2017-02-07 DIAGNOSIS — Z5181 Encounter for therapeutic drug level monitoring: Secondary | ICD-10-CM | POA: Diagnosis not present

## 2017-02-07 DIAGNOSIS — M5442 Lumbago with sciatica, left side: Secondary | ICD-10-CM | POA: Diagnosis not present

## 2017-02-07 DIAGNOSIS — Z79899 Other long term (current) drug therapy: Secondary | ICD-10-CM | POA: Diagnosis not present

## 2017-02-07 DIAGNOSIS — I4891 Unspecified atrial fibrillation: Secondary | ICD-10-CM | POA: Diagnosis not present

## 2017-03-09 DIAGNOSIS — I4891 Unspecified atrial fibrillation: Secondary | ICD-10-CM | POA: Diagnosis not present

## 2017-03-09 DIAGNOSIS — Z79899 Other long term (current) drug therapy: Secondary | ICD-10-CM | POA: Diagnosis not present

## 2017-03-09 DIAGNOSIS — Z5181 Encounter for therapeutic drug level monitoring: Secondary | ICD-10-CM | POA: Diagnosis not present

## 2017-03-09 DIAGNOSIS — J329 Chronic sinusitis, unspecified: Secondary | ICD-10-CM | POA: Diagnosis not present

## 2017-03-09 DIAGNOSIS — I25810 Atherosclerosis of coronary artery bypass graft(s) without angina pectoris: Secondary | ICD-10-CM | POA: Diagnosis not present

## 2017-03-09 DIAGNOSIS — Z23 Encounter for immunization: Secondary | ICD-10-CM | POA: Diagnosis not present

## 2017-03-09 DIAGNOSIS — G8929 Other chronic pain: Secondary | ICD-10-CM | POA: Diagnosis not present

## 2017-03-09 DIAGNOSIS — M5441 Lumbago with sciatica, right side: Secondary | ICD-10-CM | POA: Diagnosis not present

## 2017-04-08 DIAGNOSIS — M5442 Lumbago with sciatica, left side: Secondary | ICD-10-CM | POA: Diagnosis not present

## 2017-04-08 DIAGNOSIS — Z79899 Other long term (current) drug therapy: Secondary | ICD-10-CM | POA: Diagnosis not present

## 2017-04-08 DIAGNOSIS — I25810 Atherosclerosis of coronary artery bypass graft(s) without angina pectoris: Secondary | ICD-10-CM | POA: Diagnosis not present

## 2017-04-08 DIAGNOSIS — G8929 Other chronic pain: Secondary | ICD-10-CM | POA: Diagnosis not present

## 2017-04-08 DIAGNOSIS — I4891 Unspecified atrial fibrillation: Secondary | ICD-10-CM | POA: Diagnosis not present

## 2017-04-08 DIAGNOSIS — M5441 Lumbago with sciatica, right side: Secondary | ICD-10-CM | POA: Diagnosis not present

## 2017-04-08 DIAGNOSIS — Z5181 Encounter for therapeutic drug level monitoring: Secondary | ICD-10-CM | POA: Diagnosis not present

## 2017-04-20 DIAGNOSIS — I10 Essential (primary) hypertension: Secondary | ICD-10-CM | POA: Diagnosis not present

## 2017-04-20 DIAGNOSIS — F482 Pseudobulbar affect: Secondary | ICD-10-CM | POA: Diagnosis not present

## 2017-04-20 DIAGNOSIS — N183 Chronic kidney disease, stage 3 (moderate): Secondary | ICD-10-CM | POA: Diagnosis not present

## 2017-04-27 DIAGNOSIS — N183 Chronic kidney disease, stage 3 (moderate): Secondary | ICD-10-CM | POA: Diagnosis not present

## 2017-05-09 DIAGNOSIS — F482 Pseudobulbar affect: Secondary | ICD-10-CM | POA: Diagnosis not present

## 2017-05-09 DIAGNOSIS — Z5181 Encounter for therapeutic drug level monitoring: Secondary | ICD-10-CM | POA: Diagnosis not present

## 2017-05-09 DIAGNOSIS — N183 Chronic kidney disease, stage 3 (moderate): Secondary | ICD-10-CM | POA: Diagnosis not present

## 2017-05-09 DIAGNOSIS — M549 Dorsalgia, unspecified: Secondary | ICD-10-CM | POA: Diagnosis not present

## 2017-05-09 DIAGNOSIS — Z79899 Other long term (current) drug therapy: Secondary | ICD-10-CM | POA: Diagnosis not present

## 2017-05-09 DIAGNOSIS — I10 Essential (primary) hypertension: Secondary | ICD-10-CM | POA: Diagnosis not present

## 2017-06-10 DIAGNOSIS — M5441 Lumbago with sciatica, right side: Secondary | ICD-10-CM | POA: Diagnosis not present

## 2017-06-10 DIAGNOSIS — G8929 Other chronic pain: Secondary | ICD-10-CM | POA: Diagnosis not present

## 2017-06-10 DIAGNOSIS — I4891 Unspecified atrial fibrillation: Secondary | ICD-10-CM | POA: Diagnosis not present

## 2017-07-11 DIAGNOSIS — G8929 Other chronic pain: Secondary | ICD-10-CM | POA: Diagnosis not present

## 2017-07-11 DIAGNOSIS — Z5181 Encounter for therapeutic drug level monitoring: Secondary | ICD-10-CM | POA: Diagnosis not present

## 2017-07-11 DIAGNOSIS — Z79899 Other long term (current) drug therapy: Secondary | ICD-10-CM | POA: Diagnosis not present

## 2017-07-11 DIAGNOSIS — N183 Chronic kidney disease, stage 3 (moderate): Secondary | ICD-10-CM | POA: Diagnosis not present

## 2017-07-11 DIAGNOSIS — N400 Enlarged prostate without lower urinary tract symptoms: Secondary | ICD-10-CM | POA: Diagnosis not present

## 2017-07-11 DIAGNOSIS — I4891 Unspecified atrial fibrillation: Secondary | ICD-10-CM | POA: Diagnosis not present

## 2017-07-11 DIAGNOSIS — M5441 Lumbago with sciatica, right side: Secondary | ICD-10-CM | POA: Diagnosis not present

## 2017-07-22 DIAGNOSIS — N178 Other acute kidney failure: Secondary | ICD-10-CM | POA: Diagnosis not present

## 2017-07-22 DIAGNOSIS — N3020 Other chronic cystitis without hematuria: Secondary | ICD-10-CM | POA: Diagnosis not present

## 2017-07-26 DIAGNOSIS — N178 Other acute kidney failure: Secondary | ICD-10-CM | POA: Diagnosis not present

## 2017-07-26 DIAGNOSIS — N179 Acute kidney failure, unspecified: Secondary | ICD-10-CM | POA: Diagnosis not present

## 2017-08-09 DIAGNOSIS — Z79899 Other long term (current) drug therapy: Secondary | ICD-10-CM | POA: Diagnosis not present

## 2017-08-09 DIAGNOSIS — I4891 Unspecified atrial fibrillation: Secondary | ICD-10-CM | POA: Diagnosis not present

## 2017-08-09 DIAGNOSIS — Z5181 Encounter for therapeutic drug level monitoring: Secondary | ICD-10-CM | POA: Diagnosis not present

## 2017-08-09 DIAGNOSIS — M5441 Lumbago with sciatica, right side: Secondary | ICD-10-CM | POA: Diagnosis not present

## 2017-08-09 DIAGNOSIS — G8929 Other chronic pain: Secondary | ICD-10-CM | POA: Diagnosis not present

## 2017-09-12 DIAGNOSIS — I4891 Unspecified atrial fibrillation: Secondary | ICD-10-CM | POA: Diagnosis not present

## 2017-09-12 DIAGNOSIS — Z5181 Encounter for therapeutic drug level monitoring: Secondary | ICD-10-CM | POA: Diagnosis not present

## 2017-09-12 DIAGNOSIS — Z79899 Other long term (current) drug therapy: Secondary | ICD-10-CM | POA: Diagnosis not present

## 2017-09-12 DIAGNOSIS — N183 Chronic kidney disease, stage 3 (moderate): Secondary | ICD-10-CM | POA: Diagnosis not present

## 2017-09-12 DIAGNOSIS — I10 Essential (primary) hypertension: Secondary | ICD-10-CM | POA: Diagnosis not present

## 2017-09-12 DIAGNOSIS — I25810 Atherosclerosis of coronary artery bypass graft(s) without angina pectoris: Secondary | ICD-10-CM | POA: Diagnosis not present

## 2017-09-12 DIAGNOSIS — M5441 Lumbago with sciatica, right side: Secondary | ICD-10-CM | POA: Diagnosis not present

## 2017-09-12 DIAGNOSIS — G8929 Other chronic pain: Secondary | ICD-10-CM | POA: Diagnosis not present

## 2017-10-12 DIAGNOSIS — M5441 Lumbago with sciatica, right side: Secondary | ICD-10-CM | POA: Diagnosis not present

## 2017-10-12 DIAGNOSIS — G8929 Other chronic pain: Secondary | ICD-10-CM | POA: Diagnosis not present

## 2017-10-12 DIAGNOSIS — I4891 Unspecified atrial fibrillation: Secondary | ICD-10-CM | POA: Diagnosis not present

## 2017-10-12 DIAGNOSIS — Z5181 Encounter for therapeutic drug level monitoring: Secondary | ICD-10-CM | POA: Diagnosis not present

## 2017-10-12 DIAGNOSIS — Z79899 Other long term (current) drug therapy: Secondary | ICD-10-CM | POA: Diagnosis not present

## 2017-10-12 DIAGNOSIS — N183 Chronic kidney disease, stage 3 (moderate): Secondary | ICD-10-CM | POA: Diagnosis not present

## 2017-10-12 DIAGNOSIS — G4700 Insomnia, unspecified: Secondary | ICD-10-CM | POA: Diagnosis not present

## 2017-11-11 DIAGNOSIS — K5903 Drug induced constipation: Secondary | ICD-10-CM | POA: Diagnosis not present

## 2017-11-11 DIAGNOSIS — Z79899 Other long term (current) drug therapy: Secondary | ICD-10-CM | POA: Diagnosis not present

## 2017-11-11 DIAGNOSIS — Z5181 Encounter for therapeutic drug level monitoring: Secondary | ICD-10-CM | POA: Diagnosis not present

## 2017-11-11 DIAGNOSIS — G8929 Other chronic pain: Secondary | ICD-10-CM | POA: Diagnosis not present

## 2017-11-11 DIAGNOSIS — M5441 Lumbago with sciatica, right side: Secondary | ICD-10-CM | POA: Diagnosis not present

## 2017-11-11 DIAGNOSIS — N183 Chronic kidney disease, stage 3 (moderate): Secondary | ICD-10-CM | POA: Diagnosis not present

## 2017-12-12 DIAGNOSIS — K5900 Constipation, unspecified: Secondary | ICD-10-CM | POA: Diagnosis not present

## 2017-12-12 DIAGNOSIS — Z5181 Encounter for therapeutic drug level monitoring: Secondary | ICD-10-CM | POA: Diagnosis not present

## 2017-12-12 DIAGNOSIS — Z79899 Other long term (current) drug therapy: Secondary | ICD-10-CM | POA: Diagnosis not present

## 2017-12-12 DIAGNOSIS — I25810 Atherosclerosis of coronary artery bypass graft(s) without angina pectoris: Secondary | ICD-10-CM | POA: Diagnosis not present

## 2017-12-12 DIAGNOSIS — G8929 Other chronic pain: Secondary | ICD-10-CM | POA: Diagnosis not present

## 2017-12-12 DIAGNOSIS — M5442 Lumbago with sciatica, left side: Secondary | ICD-10-CM | POA: Diagnosis not present

## 2017-12-12 DIAGNOSIS — M546 Pain in thoracic spine: Secondary | ICD-10-CM | POA: Diagnosis not present

## 2018-01-13 DIAGNOSIS — Z5181 Encounter for therapeutic drug level monitoring: Secondary | ICD-10-CM | POA: Diagnosis not present

## 2018-01-13 DIAGNOSIS — M5442 Lumbago with sciatica, left side: Secondary | ICD-10-CM | POA: Diagnosis not present

## 2018-01-13 DIAGNOSIS — I25810 Atherosclerosis of coronary artery bypass graft(s) without angina pectoris: Secondary | ICD-10-CM | POA: Diagnosis not present

## 2018-01-13 DIAGNOSIS — Z79899 Other long term (current) drug therapy: Secondary | ICD-10-CM | POA: Diagnosis not present

## 2018-01-13 DIAGNOSIS — M546 Pain in thoracic spine: Secondary | ICD-10-CM | POA: Diagnosis not present

## 2018-01-13 DIAGNOSIS — K5900 Constipation, unspecified: Secondary | ICD-10-CM | POA: Diagnosis not present

## 2018-01-13 DIAGNOSIS — G8929 Other chronic pain: Secondary | ICD-10-CM | POA: Diagnosis not present

## 2018-02-13 DIAGNOSIS — Z5181 Encounter for therapeutic drug level monitoring: Secondary | ICD-10-CM | POA: Diagnosis not present

## 2018-02-13 DIAGNOSIS — Z79899 Other long term (current) drug therapy: Secondary | ICD-10-CM | POA: Diagnosis not present

## 2018-02-13 DIAGNOSIS — M5442 Lumbago with sciatica, left side: Secondary | ICD-10-CM | POA: Diagnosis not present

## 2018-02-13 DIAGNOSIS — I25810 Atherosclerosis of coronary artery bypass graft(s) without angina pectoris: Secondary | ICD-10-CM | POA: Diagnosis not present

## 2018-02-13 DIAGNOSIS — K5900 Constipation, unspecified: Secondary | ICD-10-CM | POA: Diagnosis not present

## 2018-02-13 DIAGNOSIS — M546 Pain in thoracic spine: Secondary | ICD-10-CM | POA: Diagnosis not present

## 2018-02-13 DIAGNOSIS — G8929 Other chronic pain: Secondary | ICD-10-CM | POA: Diagnosis not present

## 2018-03-16 DIAGNOSIS — G8929 Other chronic pain: Secondary | ICD-10-CM | POA: Diagnosis not present

## 2018-03-16 DIAGNOSIS — Z79899 Other long term (current) drug therapy: Secondary | ICD-10-CM | POA: Diagnosis not present

## 2018-03-16 DIAGNOSIS — Z5181 Encounter for therapeutic drug level monitoring: Secondary | ICD-10-CM | POA: Diagnosis not present

## 2018-03-16 DIAGNOSIS — K5903 Drug induced constipation: Secondary | ICD-10-CM | POA: Diagnosis not present

## 2018-03-16 DIAGNOSIS — M5442 Lumbago with sciatica, left side: Secondary | ICD-10-CM | POA: Diagnosis not present

## 2018-03-16 DIAGNOSIS — J329 Chronic sinusitis, unspecified: Secondary | ICD-10-CM | POA: Diagnosis not present

## 2018-03-16 DIAGNOSIS — M546 Pain in thoracic spine: Secondary | ICD-10-CM | POA: Diagnosis not present

## 2018-04-17 DIAGNOSIS — Z5181 Encounter for therapeutic drug level monitoring: Secondary | ICD-10-CM | POA: Diagnosis not present

## 2018-04-17 DIAGNOSIS — Z23 Encounter for immunization: Secondary | ICD-10-CM | POA: Diagnosis not present

## 2018-04-17 DIAGNOSIS — M5442 Lumbago with sciatica, left side: Secondary | ICD-10-CM | POA: Diagnosis not present

## 2018-04-17 DIAGNOSIS — J329 Chronic sinusitis, unspecified: Secondary | ICD-10-CM | POA: Diagnosis not present

## 2018-04-17 DIAGNOSIS — Z79899 Other long term (current) drug therapy: Secondary | ICD-10-CM | POA: Diagnosis not present

## 2018-04-17 DIAGNOSIS — G8929 Other chronic pain: Secondary | ICD-10-CM | POA: Diagnosis not present

## 2018-04-17 DIAGNOSIS — K5903 Drug induced constipation: Secondary | ICD-10-CM | POA: Diagnosis not present

## 2018-04-17 DIAGNOSIS — G4700 Insomnia, unspecified: Secondary | ICD-10-CM | POA: Diagnosis not present

## 2018-05-15 DIAGNOSIS — Z5181 Encounter for therapeutic drug level monitoring: Secondary | ICD-10-CM | POA: Diagnosis not present

## 2018-05-15 DIAGNOSIS — Z79899 Other long term (current) drug therapy: Secondary | ICD-10-CM | POA: Diagnosis not present

## 2018-05-15 DIAGNOSIS — G8929 Other chronic pain: Secondary | ICD-10-CM | POA: Diagnosis not present

## 2018-05-15 DIAGNOSIS — F5104 Psychophysiologic insomnia: Secondary | ICD-10-CM | POA: Diagnosis not present

## 2018-05-15 DIAGNOSIS — M5442 Lumbago with sciatica, left side: Secondary | ICD-10-CM | POA: Diagnosis not present

## 2018-05-15 DIAGNOSIS — K5903 Drug induced constipation: Secondary | ICD-10-CM | POA: Diagnosis not present

## 2018-05-15 DIAGNOSIS — F5221 Male erectile disorder: Secondary | ICD-10-CM | POA: Diagnosis not present

## 2018-06-12 DIAGNOSIS — G8929 Other chronic pain: Secondary | ICD-10-CM | POA: Diagnosis not present

## 2018-06-12 DIAGNOSIS — Z5181 Encounter for therapeutic drug level monitoring: Secondary | ICD-10-CM | POA: Diagnosis not present

## 2018-06-12 DIAGNOSIS — F5104 Psychophysiologic insomnia: Secondary | ICD-10-CM | POA: Diagnosis not present

## 2018-06-12 DIAGNOSIS — M5442 Lumbago with sciatica, left side: Secondary | ICD-10-CM | POA: Diagnosis not present

## 2018-06-12 DIAGNOSIS — K5903 Drug induced constipation: Secondary | ICD-10-CM | POA: Diagnosis not present

## 2018-06-12 DIAGNOSIS — Z79899 Other long term (current) drug therapy: Secondary | ICD-10-CM | POA: Diagnosis not present

## 2018-07-12 DIAGNOSIS — M549 Dorsalgia, unspecified: Secondary | ICD-10-CM | POA: Diagnosis not present

## 2018-07-12 DIAGNOSIS — Z79899 Other long term (current) drug therapy: Secondary | ICD-10-CM | POA: Diagnosis not present

## 2018-07-12 DIAGNOSIS — F419 Anxiety disorder, unspecified: Secondary | ICD-10-CM | POA: Diagnosis not present

## 2018-07-12 DIAGNOSIS — G8929 Other chronic pain: Secondary | ICD-10-CM | POA: Diagnosis not present

## 2018-07-12 DIAGNOSIS — Z5181 Encounter for therapeutic drug level monitoring: Secondary | ICD-10-CM | POA: Diagnosis not present

## 2018-07-12 DIAGNOSIS — K5903 Drug induced constipation: Secondary | ICD-10-CM | POA: Diagnosis not present

## 2018-08-09 DIAGNOSIS — M5442 Lumbago with sciatica, left side: Secondary | ICD-10-CM | POA: Diagnosis not present

## 2018-08-09 DIAGNOSIS — K5903 Drug induced constipation: Secondary | ICD-10-CM | POA: Diagnosis not present

## 2018-08-09 DIAGNOSIS — G8929 Other chronic pain: Secondary | ICD-10-CM | POA: Diagnosis not present

## 2018-08-09 DIAGNOSIS — H6501 Acute serous otitis media, right ear: Secondary | ICD-10-CM | POA: Diagnosis not present

## 2018-08-09 DIAGNOSIS — Z5181 Encounter for therapeutic drug level monitoring: Secondary | ICD-10-CM | POA: Diagnosis not present

## 2018-08-09 DIAGNOSIS — F5104 Psychophysiologic insomnia: Secondary | ICD-10-CM | POA: Diagnosis not present

## 2018-08-09 DIAGNOSIS — I10 Essential (primary) hypertension: Secondary | ICD-10-CM | POA: Diagnosis not present

## 2018-08-09 DIAGNOSIS — Z79899 Other long term (current) drug therapy: Secondary | ICD-10-CM | POA: Diagnosis not present

## 2018-08-25 DIAGNOSIS — F5104 Psychophysiologic insomnia: Secondary | ICD-10-CM | POA: Diagnosis not present

## 2018-09-08 DIAGNOSIS — K921 Melena: Secondary | ICD-10-CM | POA: Diagnosis not present

## 2018-09-08 DIAGNOSIS — K5903 Drug induced constipation: Secondary | ICD-10-CM | POA: Diagnosis not present

## 2018-09-08 DIAGNOSIS — Z7901 Long term (current) use of anticoagulants: Secondary | ICD-10-CM | POA: Diagnosis not present

## 2018-09-11 DIAGNOSIS — M5442 Lumbago with sciatica, left side: Secondary | ICD-10-CM | POA: Diagnosis not present

## 2018-09-11 DIAGNOSIS — K5903 Drug induced constipation: Secondary | ICD-10-CM | POA: Diagnosis not present

## 2018-09-11 DIAGNOSIS — I480 Paroxysmal atrial fibrillation: Secondary | ICD-10-CM | POA: Diagnosis not present

## 2018-09-11 DIAGNOSIS — Z79899 Other long term (current) drug therapy: Secondary | ICD-10-CM | POA: Diagnosis not present

## 2018-09-11 DIAGNOSIS — G8929 Other chronic pain: Secondary | ICD-10-CM | POA: Diagnosis not present

## 2018-09-11 DIAGNOSIS — K922 Gastrointestinal hemorrhage, unspecified: Secondary | ICD-10-CM | POA: Diagnosis not present

## 2018-09-11 DIAGNOSIS — Z5181 Encounter for therapeutic drug level monitoring: Secondary | ICD-10-CM | POA: Diagnosis not present

## 2018-10-11 DIAGNOSIS — Z79899 Other long term (current) drug therapy: Secondary | ICD-10-CM | POA: Diagnosis not present

## 2018-10-11 DIAGNOSIS — M5442 Lumbago with sciatica, left side: Secondary | ICD-10-CM | POA: Diagnosis not present

## 2018-10-11 DIAGNOSIS — I480 Paroxysmal atrial fibrillation: Secondary | ICD-10-CM | POA: Diagnosis not present

## 2018-10-11 DIAGNOSIS — Z5181 Encounter for therapeutic drug level monitoring: Secondary | ICD-10-CM | POA: Diagnosis not present

## 2018-10-11 DIAGNOSIS — K5903 Drug induced constipation: Secondary | ICD-10-CM | POA: Diagnosis not present

## 2018-10-11 DIAGNOSIS — G8929 Other chronic pain: Secondary | ICD-10-CM | POA: Diagnosis not present

## 2018-10-11 DIAGNOSIS — I25810 Atherosclerosis of coronary artery bypass graft(s) without angina pectoris: Secondary | ICD-10-CM | POA: Diagnosis not present

## 2018-11-10 DIAGNOSIS — Z79899 Other long term (current) drug therapy: Secondary | ICD-10-CM | POA: Diagnosis not present

## 2018-11-10 DIAGNOSIS — I25810 Atherosclerosis of coronary artery bypass graft(s) without angina pectoris: Secondary | ICD-10-CM | POA: Diagnosis not present

## 2018-11-10 DIAGNOSIS — G8929 Other chronic pain: Secondary | ICD-10-CM | POA: Diagnosis not present

## 2018-11-10 DIAGNOSIS — I480 Paroxysmal atrial fibrillation: Secondary | ICD-10-CM | POA: Diagnosis not present

## 2018-11-10 DIAGNOSIS — M5442 Lumbago with sciatica, left side: Secondary | ICD-10-CM | POA: Diagnosis not present

## 2018-11-10 DIAGNOSIS — K5903 Drug induced constipation: Secondary | ICD-10-CM | POA: Diagnosis not present

## 2018-11-10 DIAGNOSIS — Z5181 Encounter for therapeutic drug level monitoring: Secondary | ICD-10-CM | POA: Diagnosis not present

## 2018-11-20 ENCOUNTER — Ambulatory Visit: Payer: Medicare Other | Admitting: Cardiology

## 2018-11-22 ENCOUNTER — Ambulatory Visit (INDEPENDENT_AMBULATORY_CARE_PROVIDER_SITE_OTHER): Payer: Medicare Other | Admitting: Cardiology

## 2018-11-22 ENCOUNTER — Encounter: Payer: Self-pay | Admitting: Cardiology

## 2018-11-22 ENCOUNTER — Other Ambulatory Visit: Payer: Self-pay

## 2018-11-22 VITALS — BP 110/72 | HR 81 | Ht 71.0 in | Wt 176.8 lb

## 2018-11-22 DIAGNOSIS — Z95810 Presence of automatic (implantable) cardiac defibrillator: Secondary | ICD-10-CM

## 2018-11-22 DIAGNOSIS — N183 Chronic kidney disease, stage 3 unspecified: Secondary | ICD-10-CM

## 2018-11-22 DIAGNOSIS — I5042 Chronic combined systolic (congestive) and diastolic (congestive) heart failure: Secondary | ICD-10-CM | POA: Diagnosis not present

## 2018-11-22 DIAGNOSIS — I252 Old myocardial infarction: Secondary | ICD-10-CM

## 2018-11-22 DIAGNOSIS — Z7901 Long term (current) use of anticoagulants: Secondary | ICD-10-CM

## 2018-11-22 NOTE — Patient Instructions (Signed)
Medication Instructions:  Your physician recommends that you continue on your current medications as directed. Please refer to the Current Medication list given to you today.  If you need a refill on your cardiac medications before your next appointment, please call your pharmacy.   Lab work: Your physician recommends that you return for lab work today: bmp   If you have labs (blood work) drawn today and your tests are completely normal, you will receive your results only by: Marland Kitchen MyChart Message (if you have MyChart) OR . A paper copy in the mail If you have any lab test that is abnormal or we need to change your treatment, we will call you to review the results.  Testing/Procedures: Your physician has requested that you have an echocardiogram. Echocardiography is a painless test that uses sound waves to create images of your heart. It provides your doctor with information about the size and shape of your heart and how well your heart's chambers and valves are working. This procedure takes approximately one hour. There are no restrictions for this procedure.    Follow-Up: At [facility], you and your health needs are our priority.  As part of our continuing mission to provide you with exceptional heart care, we have created designated Provider Care Teams.  These Care Teams include your primary Cardiologist (physician) and Advanced Practice Providers (APPs -  Physician Assistants and Nurse Practitioners) who all work together to provide you with the care you need, when you need it. You will need a follow up appointment in 1 months.  Please call our office 2 months in advance to schedule this appointment.  You may see No primary care provider on file. or another member of our [employer] Provider Team in [place]: Shirlee More, MD . Jyl Heinz, MD  Any Other Special Instructions Will Be Listed Below (If Applicable).   Echocardiogram An echocardiogram is a procedure that uses  painless sound waves (ultrasound) to produce an image of the heart. Images from an echocardiogram can provide important information about:  Signs of coronary artery disease (CAD).  Aneurysm detection. An aneurysm is a weak or damaged part of an artery wall that bulges out from the normal force of blood pumping through the body.  Heart size and shape. Changes in the size or shape of the heart can be associated with certain conditions, including heart failure, aneurysm, and CAD.  Heart muscle function.  Heart valve function.  Signs of a past heart attack.  Fluid buildup around the heart.  Thickening of the heart muscle.  A tumor or infectious growth around the heart valves. Tell a health care provider about:  Any allergies you have.  All medicines you are taking, including vitamins, herbs, eye drops, creams, and over-the-counter medicines.  Any blood disorders you have.  Any surgeries you have had.  Any medical conditions you have.  Whether you are pregnant or may be pregnant. What are the risks? Generally, this is a safe procedure. However, problems may occur, including:  Allergic reaction to dye (contrast) that may be used during the procedure. What happens before the procedure? No specific preparation is needed. You may eat and drink normally. What happens during the procedure?   An IV tube may be inserted into one of your veins.  You may receive contrast through this tube. A contrast is an injection that improves the quality of the pictures from your heart.  A gel will be applied to your chest.  A wand-like tool (transducer)  will be moved over your chest. The gel will help to transmit the sound waves from the transducer.  The sound waves will harmlessly bounce off of your heart to allow the heart images to be captured in real-time motion. The images will be recorded on a computer. The procedure may vary among health care providers and hospitals. What happens after  the procedure?  You may return to your normal, everyday life, including diet, activities, and medicines, unless your health care provider tells you not to do that. Summary  An echocardiogram is a procedure that uses painless sound waves (ultrasound) to produce an image of the heart.  Images from an echocardiogram can provide important information about the size and shape of your heart, heart muscle function, heart valve function, and fluid buildup around your heart.  You do not need to do anything to prepare before this procedure. You may eat and drink normally.  After the echocardiogram is completed, you may return to your normal, everyday life, unless your health care provider tells you not to do that. This information is not intended to replace advice given to you by your health care provider. Make sure you discuss any questions you have with your health care provider. Document Released: 05/07/2000 Document Revised: 08/31/2018 Document Reviewed: 06/12/2016 Elsevier Patient Education  2020 [employer].

## 2018-11-22 NOTE — Progress Notes (Signed)
Cardiology Consultation:    Date:  11/22/2018   ID:  Carlos Wright, DOB 1960/11/28, MRN [id number]  PCP:  Garwin Brothers, MD  Cardiologist:  Jenne Campus, MD   Referring MD: Garwin Brothers, MD   Chief Complaint  Patient presents with  . Atrial Fibrillation  I need colonoscopy  History of Present Illness:    Carlos Wright is a 58 y.o. male who is being seen today for the evaluation of evaluation before colonoscopy at the request of Garwin Brothers, MD.  Carlos Wright is upset and correctable.  According to the patient he got 10 myocardial infarction in his life first 1 before he was 19.  According to him he got coronary artery bypass graft when he was 58 years old it was done in [place].  Since that time he described at least 3 angioplasty in his heart also described to have 2 CVAs 1 involving spine and one involving brain it left him with some memory issues but otherwise seems to be neurologically intact.  He was referred to me because there is a screening colonoscopy that being schedule and he needs evaluation from cardiac standpoint of view for it.  He also does have CRT/D with his [employer] device.  He does not know why he has it and he does not remember when it was implanted he does not remember when it was checked last time.  Last documentation of the interrogation of the device I have is from 2016.  About the normal function then.  Also history of atrial fibrillation which is persistent.  Some additional data available from primary care physician office showing last echocardiogram done in 2017 with ejection fraction of 25 to 30%. Overall he is doing very well he just came back 10 days from [facility] she did have a good time.  He was able to walk climb stairs with no difficulties overall amazingly in spite of all the problem that he described he is doing well. Denies having recent chest pain No dizziness or passing out No discharges from the defibrillator  No past medical history on file.   Coronary artery disease status post multiple myocardial functions ICD present which is CRT/D device Multiple CVAs Permanent atrial fibrillation Anticoagulated  Current Medications: Current Meds  Medication Sig  . amitriptyline (ELAVIL) 25 MG tablet Take 1 tablet by mouth at bedtime.  . digoxin (LANOXIN) 0.25 MG tablet Take 0.25 mg by mouth daily.  . DULoxetine (CYMBALTA) 30 MG capsule Take 1 capsule by mouth daily.  . fluticasone (FLONASE) 50 MCG/ACT nasal spray Place 1 spray into both nostrils daily.  . furosemide (LASIX) 20 MG tablet Take 2 tablets by mouth daily.  Marland Kitchen gabapentin (NEURONTIN) 600 MG tablet Take 1 tablet by mouth 3 (three) times daily.  . Melatonin 5 MG TABS Take by mouth at bedtime as needed.  . meloxicam (MOBIC) 7.5 MG tablet Take 7.5 mg by mouth daily.  . methocarbamol (ROBAXIN) 500 MG tablet Take 500 mg by mouth 3 (three) times daily.   . metoprolol tartrate (LOPRESSOR) 25 MG tablet Take 1 tablet by mouth 2 (two) times daily.  . naloxegol oxalate (MOVANTIK) 25 MG TABS tablet Take 25 mg by mouth daily.  . Oxycodone HCl 10 MG TABS TAKE 1 TABLET (10 MG) BY ORAL ROUTE EVERY 6 HOURS PRN  . rosuvastatin (CRESTOR) 40 MG tablet Take 1 tablet by mouth daily.  . sildenafil (VIAGRA) 50 MG tablet Take 50 mg by mouth daily as needed for  erectile dysfunction.  Carlos Wright. XARELTO 15 MG TABS tablet Take 1 tablet by mouth daily.     Allergies:   Iodinated diagnostic agents   Social History   Socioeconomic History  . Marital status: Married    Spouse name: Not on file  . Number of children: Not on file  . Years of education: Not on file  . Highest education level: Not on file  Occupational History  . Not on file  Social Needs  . Financial resource strain: Not on file  . Food insecurity    Worry: Not on file    Inability: Not on file  . Transportation needs    Medical: Not on file    Non-medical: Not on file  Tobacco Use  . Smoking status: Current Every Day Smoker    Types:  Cigarettes  . Smokeless tobacco: Former Engineer, waterUser  Substance and Sexual Activity  . Alcohol use: Yes    Comment: Every once in a while  . Drug use: Never  . Sexual activity: Not on file  Lifestyle  . Physical activity    Days per week: Not on file    Minutes per session: Not on file  . Stress: Not on file  Relationships  . Social Musicianconnections    Talks on phone: Not on file    Gets together: Not on file    Attends religious service: Not on file    Active member of club or organization: Not on file    Attends meetings of clubs or organizations: Not on file    Relationship status: Not on file  Other Topics Concern  . Not on file  Social History Narrative  . Not on file     Family History: The patient's family history is not on file. ROS:   Please see the history of present illness.    All 14 point review of systems negative except as described per history of present illness.  EKGs/Labs/Other Studies Reviewed:    The following studies were reviewed today:   EKG:  EKG is  ordered today.  The ekg ordered today demonstrates what appears to be atrial fibrillation however baseline EKG is poor quality difficult to determine.  I cannot see clear-cut spike on the EKG.  He does have intraventricular conduction delay.  Recent Labs: No results found for requested labs within last 8760 hours.  Recent Lipid Panel No results found for: CHOL, TRIG, HDL, CHOLHDL, VLDL, LDLCALC, LDLDIRECT  Physical Exam:    VS:  BP 110/72   Pulse 81   Ht 5\' 11"  (1.803 m)   Wt 176 lb 12.8 oz (80.2 kg)   SpO2 98%   BMI 24.66 kg/m     Wt Readings from Last 3 Encounters:  11/22/18 176 lb 12.8 oz (80.2 kg)     GEN:  Well nourished, well developed in no acute distress HEENT: Normal NECK: No JVD; No carotid bruits LYMPHATICS: No lymphadenopathy CARDIAC: RRR, no murmurs, no rubs, no gallops RESPIRATORY:  Clear to auscultation without rales, wheezing or rhonchi  ABDOMEN: Soft, non-tender, non-distended  MUSCULOSKELETAL:  No edema; No deformity  SKIN: Warm and dry NEUROLOGIC:  Alert and oriented x 3 PSYCHIATRIC:  Normal affect   ASSESSMENT:    1. Old myocardial infarct   2. Chronic combined systolic and diastolic congestive heart failure (HCC)   3. CKD (chronic kidney disease) stage 3, GFR 30-59 ml/min (HCC)   4. Long term (current) use of anticoagulants   5. Biventricular implantable cardioverter-defibrillator in situ  PLAN:    In order of problems listed above:  1. Quite incredible story gentleman with premature and aggressive coronary artery disease still doing well in spite of 30 years history of it.  Overall doing well I will ask him to have echocardiogram to assess left ventricular ejection fraction.  I will not modify any medication to have more data about his cardiac status.  Obviously if his ejection fraction still diminished we will need to make sure that he is on appropriate medication for his cardiomyopathy.  Will do some laboratory test on him today try to see what the kidney function is.  He does have chronic kidney dysfunction. 2. ICD present.  Will call pacemaker rep to have his device interrogated.  Last interrogation look like it was done in 2016. 3. History of CVA.  Noted no residual neurological finding on physical exam except for some memory issue. 4. Chronic kidney failure will check kidney function today. 5. Coronary artery disease status post coronary bypass graft.  In the future we may be forced to do stress test to look for any residual ischemia however clinically he seems to be doing well.   Medication Adjustments/Labs and Tests Ordered: Current medicines are reviewed at length with the patient today.  Concerns regarding medicines are outlined above.  No orders of the defined types were placed in this encounter.  No orders of the defined types were placed in this encounter.   Signed, Georgeanna Lea, MD, [facility]. 11/22/2018 2:31 PM    [facility]

## 2018-11-22 NOTE — Addendum Note (Signed)
Addended by: Ashok Norris on: 11/22/2018 02:45 PM   Modules accepted: Orders

## 2018-11-23 LAB — BASIC METABOLIC PANEL
BUN/Creatinine Ratio: 12 (ref 9–20)
BUN: 17 mg/dL (ref 6–24)
CO2: 19 mmol/L — ABNORMAL LOW (ref 20–29)
Calcium: 9.2 mg/dL (ref 8.7–10.2)
Chloride: 100 mmol/L (ref 96–106)
Creatinine, Ser: 1.4 mg/dL — ABNORMAL HIGH (ref 0.76–1.27)
GFR calc Af Amer: 64 mL/min/{1.73_m2} (ref >59)
GFR calc non Af Amer: 56 mL/min/{1.73_m2} — ABNORMAL LOW (ref >59)
Glucose: 99 mg/dL (ref 65–99)
Potassium: 4.4 mmol/L (ref 3.5–5.2)
Sodium: 138 mmol/L (ref 134–144)

## 2018-11-27 ENCOUNTER — Telehealth: Payer: Self-pay | Admitting: *Deleted

## 2018-11-27 NOTE — Telephone Encounter (Signed)
-----   Message from Park Liter, MD sent at 11/27/2018  8:14 AM EDT ----- Creat mildly elevated  Cont same

## 2018-11-27 NOTE — Telephone Encounter (Signed)
Telephone call to patient. Left message regarding lab results and to call back with any questions.

## 2018-11-30 ENCOUNTER — Telehealth: Payer: Self-pay | Admitting: Student

## 2018-11-30 NOTE — Telephone Encounter (Signed)
Have requested transfer of patient into [facility] from [facility].   Will add to [facility]

## 2018-12-01 NOTE — Telephone Encounter (Signed)
LMOVM for Kennyth Lose w/ [facility] to release pt.

## 2018-12-08 DIAGNOSIS — Z79899 Other long term (current) drug therapy: Secondary | ICD-10-CM | POA: Diagnosis not present

## 2018-12-08 DIAGNOSIS — Z5181 Encounter for therapeutic drug level monitoring: Secondary | ICD-10-CM | POA: Diagnosis not present

## 2018-12-08 DIAGNOSIS — I2510 Atherosclerotic heart disease of native coronary artery without angina pectoris: Secondary | ICD-10-CM | POA: Diagnosis not present

## 2018-12-08 DIAGNOSIS — K5903 Drug induced constipation: Secondary | ICD-10-CM | POA: Diagnosis not present

## 2018-12-08 DIAGNOSIS — G8929 Other chronic pain: Secondary | ICD-10-CM | POA: Diagnosis not present

## 2018-12-08 DIAGNOSIS — I25810 Atherosclerosis of coronary artery bypass graft(s) without angina pectoris: Secondary | ICD-10-CM | POA: Diagnosis not present

## 2018-12-08 DIAGNOSIS — M5442 Lumbago with sciatica, left side: Secondary | ICD-10-CM | POA: Diagnosis not present

## 2018-12-08 DIAGNOSIS — I480 Paroxysmal atrial fibrillation: Secondary | ICD-10-CM | POA: Diagnosis not present

## 2018-12-11 NOTE — Telephone Encounter (Signed)
LMOM for Kennyth Lose with [facility] to contact DC to release pt in [place].

## 2018-12-12 NOTE — Telephone Encounter (Signed)
Pt released in [facility]

## 2019-01-05 ENCOUNTER — Other Ambulatory Visit: Payer: Self-pay

## 2019-01-05 ENCOUNTER — Ambulatory Visit (INDEPENDENT_AMBULATORY_CARE_PROVIDER_SITE_OTHER): Payer: Medicare Other

## 2019-01-05 DIAGNOSIS — I5042 Chronic combined systolic (congestive) and diastolic (congestive) heart failure: Secondary | ICD-10-CM | POA: Diagnosis not present

## 2019-01-05 NOTE — Progress Notes (Signed)
Complete echocardiogram has been performed.  Jimmy Giovannie Scerbo RDCS, RVT 

## 2019-01-08 DIAGNOSIS — I2510 Atherosclerotic heart disease of native coronary artery without angina pectoris: Secondary | ICD-10-CM | POA: Diagnosis not present

## 2019-01-08 DIAGNOSIS — G8929 Other chronic pain: Secondary | ICD-10-CM | POA: Diagnosis not present

## 2019-01-08 DIAGNOSIS — Z79899 Other long term (current) drug therapy: Secondary | ICD-10-CM | POA: Diagnosis not present

## 2019-01-08 DIAGNOSIS — M5442 Lumbago with sciatica, left side: Secondary | ICD-10-CM | POA: Diagnosis not present

## 2019-01-08 DIAGNOSIS — I480 Paroxysmal atrial fibrillation: Secondary | ICD-10-CM | POA: Diagnosis not present

## 2019-01-08 DIAGNOSIS — Z5181 Encounter for therapeutic drug level monitoring: Secondary | ICD-10-CM | POA: Diagnosis not present

## 2019-01-08 DIAGNOSIS — J209 Acute bronchitis, unspecified: Secondary | ICD-10-CM | POA: Diagnosis not present

## 2019-01-10 ENCOUNTER — Telehealth: Payer: Self-pay

## 2019-01-10 ENCOUNTER — Ambulatory Visit: Payer: Medicare Other | Admitting: Cardiology

## 2019-01-10 NOTE — Telephone Encounter (Signed)
Patient returned call for results, please call back

## 2019-01-10 NOTE — Telephone Encounter (Signed)
Left voice message requesting return call to give echocardiogram results.

## 2019-01-16 MED ORDER — CARVEDILOL 3.125 MG PO TABS: 3.1250 mg | ORAL_TABLET | Freq: Two times a day (BID) | ORAL | 1 refills | Status: DC

## 2019-01-16 NOTE — Addendum Note (Signed)
Addended by: Ashok Norris on: 01/16/2019 08:29 AM   Modules accepted: Orders

## 2019-01-16 NOTE — Telephone Encounter (Signed)
Called patient. Informed him of echo results and to stop metoprolol. Patient also advised to start carvedilol 3.125 mg twice daily per Dr. Agustin Cree. Patient verbally understands. No further questions.

## 2019-02-07 DIAGNOSIS — Z79899 Other long term (current) drug therapy: Secondary | ICD-10-CM | POA: Diagnosis not present

## 2019-02-07 DIAGNOSIS — Z5181 Encounter for therapeutic drug level monitoring: Secondary | ICD-10-CM | POA: Diagnosis not present

## 2019-02-07 DIAGNOSIS — M5442 Lumbago with sciatica, left side: Secondary | ICD-10-CM | POA: Diagnosis not present

## 2019-02-07 DIAGNOSIS — Z23 Encounter for immunization: Secondary | ICD-10-CM | POA: Diagnosis not present

## 2019-02-07 DIAGNOSIS — G8929 Other chronic pain: Secondary | ICD-10-CM | POA: Diagnosis not present

## 2019-03-01 ENCOUNTER — Ambulatory Visit (INDEPENDENT_AMBULATORY_CARE_PROVIDER_SITE_OTHER): Payer: Medicare Other | Admitting: *Deleted

## 2019-03-01 DIAGNOSIS — I442 Atrioventricular block, complete: Secondary | ICD-10-CM

## 2019-03-01 DIAGNOSIS — I5042 Chronic combined systolic (congestive) and diastolic (congestive) heart failure: Secondary | ICD-10-CM | POA: Diagnosis not present

## 2019-03-01 LAB — CUP PACEART REMOTE DEVICE CHECK
Battery Remaining Longevity: 24 mo
Battery Remaining Percentage: 28 %
Battery Voltage: 2.84 V
Date Time Interrogation Session: 20201007060014
HighPow Impedance: 65 Ohm
HighPow Impedance: 65 Ohm
Implantable Lead Implant Date: 20150615
Implantable Lead Implant Date: 20150615
Implantable Lead Implant Date: 20150615
Implantable Lead Location: 753858
Implantable Lead Location: 753859
Implantable Lead Location: 753860
Implantable Lead Model: 293
Implantable Lead Serial Number: 342748
Implantable Pulse Generator Implant Date: 20150615
Lead Channel Impedance Value: 400 Ohm
Lead Channel Impedance Value: 440 Ohm
Lead Channel Impedance Value: 840 Ohm
Lead Channel Pacing Threshold Amplitude: 0.5 V
Lead Channel Pacing Threshold Amplitude: 1.625 V
Lead Channel Pacing Threshold Pulse Width: 0.5 ms
Lead Channel Pacing Threshold Pulse Width: 0.7 ms
Lead Channel Sensing Intrinsic Amplitude: 0.2 mV
Lead Channel Sensing Intrinsic Amplitude: 10.9 mV
Lead Channel Setting Pacing Amplitude: 2.5 V
Lead Channel Setting Pacing Amplitude: 2.625
Lead Channel Setting Pacing Pulse Width: 0.5 ms
Lead Channel Setting Pacing Pulse Width: 0.7 ms
Lead Channel Setting Sensing Sensitivity: 0.5 mV
Pulse Gen Serial Number: 7190202

## 2019-03-07 DIAGNOSIS — I25810 Atherosclerosis of coronary artery bypass graft(s) without angina pectoris: Secondary | ICD-10-CM | POA: Diagnosis not present

## 2019-03-07 DIAGNOSIS — Z6826 Body mass index (BMI) 26.0-26.9, adult: Secondary | ICD-10-CM | POA: Diagnosis not present

## 2019-03-07 DIAGNOSIS — I10 Essential (primary) hypertension: Secondary | ICD-10-CM | POA: Diagnosis not present

## 2019-03-07 DIAGNOSIS — M5442 Lumbago with sciatica, left side: Secondary | ICD-10-CM | POA: Diagnosis not present

## 2019-03-07 DIAGNOSIS — Z1389 Encounter for screening for other disorder: Secondary | ICD-10-CM | POA: Diagnosis not present

## 2019-03-07 DIAGNOSIS — Z5181 Encounter for therapeutic drug level monitoring: Secondary | ICD-10-CM | POA: Diagnosis not present

## 2019-03-07 DIAGNOSIS — Z0000 Encounter for general adult medical examination without abnormal findings: Secondary | ICD-10-CM | POA: Diagnosis not present

## 2019-03-07 DIAGNOSIS — N529 Male erectile dysfunction, unspecified: Secondary | ICD-10-CM | POA: Diagnosis not present

## 2019-03-07 DIAGNOSIS — Z79899 Other long term (current) drug therapy: Secondary | ICD-10-CM | POA: Diagnosis not present

## 2019-03-07 DIAGNOSIS — G8929 Other chronic pain: Secondary | ICD-10-CM | POA: Diagnosis not present

## 2019-03-07 DIAGNOSIS — Z23 Encounter for immunization: Secondary | ICD-10-CM | POA: Diagnosis not present

## 2019-03-12 NOTE — Progress Notes (Signed)
Remote ICD transmission.   

## 2019-03-16 ENCOUNTER — Ambulatory Visit: Payer: Medicare Other | Admitting: Cardiology

## 2019-04-09 DIAGNOSIS — Z5181 Encounter for therapeutic drug level monitoring: Secondary | ICD-10-CM | POA: Diagnosis not present

## 2019-04-09 DIAGNOSIS — G8929 Other chronic pain: Secondary | ICD-10-CM | POA: Diagnosis not present

## 2019-04-09 DIAGNOSIS — M5442 Lumbago with sciatica, left side: Secondary | ICD-10-CM | POA: Diagnosis not present

## 2019-04-09 DIAGNOSIS — Z79899 Other long term (current) drug therapy: Secondary | ICD-10-CM | POA: Diagnosis not present

## 2019-04-12 ENCOUNTER — Ambulatory Visit: Payer: Medicare Other | Admitting: Cardiology

## 2019-05-31 ENCOUNTER — Ambulatory Visit (INDEPENDENT_AMBULATORY_CARE_PROVIDER_SITE_OTHER): Payer: Medicare Other | Admitting: *Deleted

## 2019-05-31 DIAGNOSIS — I5042 Chronic combined systolic (congestive) and diastolic (congestive) heart failure: Secondary | ICD-10-CM | POA: Diagnosis not present

## 2019-06-01 LAB — CUP PACEART REMOTE DEVICE CHECK
Battery Remaining Longevity: 23 mo
Battery Remaining Percentage: 27 %
Battery Voltage: 2.83 V
Date Time Interrogation Session: 20210107162213
HighPow Impedance: 63 Ohm
HighPow Impedance: 63 Ohm
Implantable Lead Implant Date: 20150615
Implantable Lead Implant Date: 20150615
Implantable Lead Implant Date: 20150615
Implantable Lead Location: 753858
Implantable Lead Location: 753859
Implantable Lead Location: 753860
Implantable Lead Model: 293
Implantable Lead Serial Number: 342748
Implantable Pulse Generator Implant Date: 20150615
Lead Channel Impedance Value: 410 Ohm
Lead Channel Impedance Value: 430 Ohm
Lead Channel Impedance Value: 760 Ohm
Lead Channel Pacing Threshold Amplitude: 0.5 V
Lead Channel Pacing Threshold Amplitude: 1.125 V
Lead Channel Pacing Threshold Pulse Width: 0.5 ms
Lead Channel Pacing Threshold Pulse Width: 0.7 ms
Lead Channel Sensing Intrinsic Amplitude: 0.3 mV
Lead Channel Sensing Intrinsic Amplitude: 12 mV
Lead Channel Setting Pacing Amplitude: 2.125
Lead Channel Setting Pacing Amplitude: 2.5 V
Lead Channel Setting Pacing Pulse Width: 0.5 ms
Lead Channel Setting Pacing Pulse Width: 0.7 ms
Lead Channel Setting Sensing Sensitivity: 0.5 mV
Pulse Gen Serial Number: 7190202

## 2019-06-11 DIAGNOSIS — Z79899 Other long term (current) drug therapy: Secondary | ICD-10-CM | POA: Diagnosis not present

## 2019-06-11 DIAGNOSIS — Z5181 Encounter for therapeutic drug level monitoring: Secondary | ICD-10-CM | POA: Diagnosis not present

## 2019-06-11 DIAGNOSIS — M5442 Lumbago with sciatica, left side: Secondary | ICD-10-CM | POA: Diagnosis not present

## 2019-06-11 DIAGNOSIS — G8929 Other chronic pain: Secondary | ICD-10-CM | POA: Diagnosis not present

## 2019-06-11 DIAGNOSIS — I480 Paroxysmal atrial fibrillation: Secondary | ICD-10-CM | POA: Diagnosis not present

## 2019-06-11 DIAGNOSIS — I5042 Chronic combined systolic (congestive) and diastolic (congestive) heart failure: Secondary | ICD-10-CM | POA: Diagnosis not present

## 2019-06-11 DIAGNOSIS — N1831 Chronic kidney disease, stage 3a: Secondary | ICD-10-CM | POA: Diagnosis not present

## 2019-07-13 DIAGNOSIS — Z1152 Encounter for screening for COVID-19: Secondary | ICD-10-CM | POA: Diagnosis not present

## 2019-07-13 DIAGNOSIS — M5442 Lumbago with sciatica, left side: Secondary | ICD-10-CM | POA: Diagnosis not present

## 2019-07-13 DIAGNOSIS — I5042 Chronic combined systolic (congestive) and diastolic (congestive) heart failure: Secondary | ICD-10-CM | POA: Diagnosis not present

## 2019-07-13 DIAGNOSIS — Z79899 Other long term (current) drug therapy: Secondary | ICD-10-CM | POA: Diagnosis not present

## 2019-07-13 DIAGNOSIS — J310 Chronic rhinitis: Secondary | ICD-10-CM | POA: Diagnosis not present

## 2019-07-13 DIAGNOSIS — G8929 Other chronic pain: Secondary | ICD-10-CM | POA: Diagnosis not present

## 2019-07-13 DIAGNOSIS — J329 Chronic sinusitis, unspecified: Secondary | ICD-10-CM | POA: Diagnosis not present

## 2019-07-13 DIAGNOSIS — N1831 Chronic kidney disease, stage 3a: Secondary | ICD-10-CM | POA: Diagnosis not present

## 2019-07-13 DIAGNOSIS — Z5181 Encounter for therapeutic drug level monitoring: Secondary | ICD-10-CM | POA: Diagnosis not present

## 2019-08-10 DIAGNOSIS — N1831 Chronic kidney disease, stage 3a: Secondary | ICD-10-CM | POA: Diagnosis not present

## 2019-08-10 DIAGNOSIS — M5442 Lumbago with sciatica, left side: Secondary | ICD-10-CM | POA: Diagnosis not present

## 2019-08-10 DIAGNOSIS — G8929 Other chronic pain: Secondary | ICD-10-CM | POA: Diagnosis not present

## 2019-08-10 DIAGNOSIS — Z5181 Encounter for therapeutic drug level monitoring: Secondary | ICD-10-CM | POA: Diagnosis not present

## 2019-08-10 DIAGNOSIS — Z87891 Personal history of nicotine dependence: Secondary | ICD-10-CM | POA: Diagnosis not present

## 2019-08-10 DIAGNOSIS — I5042 Chronic combined systolic (congestive) and diastolic (congestive) heart failure: Secondary | ICD-10-CM | POA: Diagnosis not present

## 2019-08-10 DIAGNOSIS — I480 Paroxysmal atrial fibrillation: Secondary | ICD-10-CM | POA: Diagnosis not present

## 2019-08-10 DIAGNOSIS — Z79899 Other long term (current) drug therapy: Secondary | ICD-10-CM | POA: Diagnosis not present

## 2019-08-13 DIAGNOSIS — Z23 Encounter for immunization: Secondary | ICD-10-CM | POA: Diagnosis not present

## 2019-08-21 DIAGNOSIS — Z2821 Immunization not carried out because of patient refusal: Secondary | ICD-10-CM | POA: Diagnosis not present

## 2019-08-21 DIAGNOSIS — Z951 Presence of aortocoronary bypass graft: Secondary | ICD-10-CM | POA: Diagnosis not present

## 2019-08-21 DIAGNOSIS — R404 Transient alteration of awareness: Secondary | ICD-10-CM | POA: Diagnosis not present

## 2019-08-21 DIAGNOSIS — T50901A Poisoning by unspecified drugs, medicaments and biological substances, accidental (unintentional), initial encounter: Secondary | ICD-10-CM | POA: Diagnosis present

## 2019-08-21 DIAGNOSIS — R0689 Other abnormalities of breathing: Secondary | ICD-10-CM | POA: Diagnosis present

## 2019-08-21 DIAGNOSIS — I5022 Chronic systolic (congestive) heart failure: Secondary | ICD-10-CM | POA: Diagnosis not present

## 2019-08-21 DIAGNOSIS — J449 Chronic obstructive pulmonary disease, unspecified: Secondary | ICD-10-CM | POA: Diagnosis present

## 2019-08-21 DIAGNOSIS — R4182 Altered mental status, unspecified: Secondary | ICD-10-CM | POA: Diagnosis not present

## 2019-08-21 DIAGNOSIS — G9340 Encephalopathy, unspecified: Secondary | ICD-10-CM | POA: Diagnosis not present

## 2019-08-21 DIAGNOSIS — I4891 Unspecified atrial fibrillation: Secondary | ICD-10-CM | POA: Diagnosis not present

## 2019-08-21 DIAGNOSIS — I110 Hypertensive heart disease with heart failure: Secondary | ICD-10-CM | POA: Diagnosis present

## 2019-08-21 DIAGNOSIS — Z79891 Long term (current) use of opiate analgesic: Secondary | ICD-10-CM | POA: Diagnosis not present

## 2019-08-21 DIAGNOSIS — I340 Nonrheumatic mitral (valve) insufficiency: Secondary | ICD-10-CM | POA: Diagnosis not present

## 2019-08-21 DIAGNOSIS — I252 Old myocardial infarction: Secondary | ICD-10-CM | POA: Diagnosis not present

## 2019-08-21 DIAGNOSIS — I2510 Atherosclerotic heart disease of native coronary artery without angina pectoris: Secondary | ICD-10-CM | POA: Diagnosis present

## 2019-08-21 DIAGNOSIS — M1990 Unspecified osteoarthritis, unspecified site: Secondary | ICD-10-CM | POA: Diagnosis present

## 2019-08-21 DIAGNOSIS — I69311 Memory deficit following cerebral infarction: Secondary | ICD-10-CM | POA: Diagnosis not present

## 2019-08-21 DIAGNOSIS — I4811 Longstanding persistent atrial fibrillation: Secondary | ICD-10-CM | POA: Diagnosis present

## 2019-08-21 DIAGNOSIS — R0989 Other specified symptoms and signs involving the circulatory and respiratory systems: Secondary | ICD-10-CM | POA: Diagnosis not present

## 2019-08-21 DIAGNOSIS — I959 Hypotension, unspecified: Secondary | ICD-10-CM | POA: Diagnosis present

## 2019-08-21 DIAGNOSIS — Z7901 Long term (current) use of anticoagulants: Secondary | ICD-10-CM | POA: Diagnosis not present

## 2019-08-21 DIAGNOSIS — D72829 Elevated white blood cell count, unspecified: Secondary | ICD-10-CM | POA: Diagnosis present

## 2019-08-21 DIAGNOSIS — Z20822 Contact with and (suspected) exposure to covid-19: Secondary | ICD-10-CM | POA: Diagnosis not present

## 2019-08-21 DIAGNOSIS — Z4682 Encounter for fitting and adjustment of non-vascular catheter: Secondary | ICD-10-CM | POA: Diagnosis not present

## 2019-08-21 DIAGNOSIS — J690 Pneumonitis due to inhalation of food and vomit: Secondary | ICD-10-CM | POA: Diagnosis not present

## 2019-08-21 DIAGNOSIS — I69315 Cognitive social or emotional deficit following cerebral infarction: Secondary | ICD-10-CM | POA: Diagnosis not present

## 2019-08-21 DIAGNOSIS — I361 Nonrheumatic tricuspid (valve) insufficiency: Secondary | ICD-10-CM | POA: Diagnosis not present

## 2019-08-21 DIAGNOSIS — R410 Disorientation, unspecified: Secondary | ICD-10-CM | POA: Diagnosis present

## 2019-08-21 DIAGNOSIS — G894 Chronic pain syndrome: Secondary | ICD-10-CM | POA: Diagnosis present

## 2019-08-21 DIAGNOSIS — Z95810 Presence of automatic (implantable) cardiac defibrillator: Secondary | ICD-10-CM | POA: Diagnosis not present

## 2019-08-21 DIAGNOSIS — Z905 Acquired absence of kidney: Secondary | ICD-10-CM | POA: Diagnosis not present

## 2019-08-21 DIAGNOSIS — Z538 Procedure and treatment not carried out for other reasons: Secondary | ICD-10-CM | POA: Diagnosis present

## 2019-08-21 DIAGNOSIS — Z9911 Dependence on respirator [ventilator] status: Secondary | ICD-10-CM | POA: Diagnosis not present

## 2019-08-22 DIAGNOSIS — R410 Disorientation, unspecified: Secondary | ICD-10-CM | POA: Diagnosis not present

## 2019-08-22 DIAGNOSIS — I5022 Chronic systolic (congestive) heart failure: Secondary | ICD-10-CM | POA: Diagnosis not present

## 2019-08-22 DIAGNOSIS — I4891 Unspecified atrial fibrillation: Secondary | ICD-10-CM | POA: Diagnosis not present

## 2019-08-23 DIAGNOSIS — R410 Disorientation, unspecified: Secondary | ICD-10-CM | POA: Diagnosis not present

## 2019-08-23 DIAGNOSIS — I4891 Unspecified atrial fibrillation: Secondary | ICD-10-CM | POA: Diagnosis not present

## 2019-08-23 DIAGNOSIS — I5022 Chronic systolic (congestive) heart failure: Secondary | ICD-10-CM | POA: Diagnosis not present

## 2019-08-24 ENCOUNTER — Other Ambulatory Visit: Payer: Self-pay | Admitting: Cardiology

## 2019-08-24 DIAGNOSIS — I4891 Unspecified atrial fibrillation: Secondary | ICD-10-CM | POA: Diagnosis not present

## 2019-08-24 DIAGNOSIS — R410 Disorientation, unspecified: Secondary | ICD-10-CM | POA: Diagnosis not present

## 2019-08-24 DIAGNOSIS — I5022 Chronic systolic (congestive) heart failure: Secondary | ICD-10-CM | POA: Diagnosis not present

## 2019-08-25 DIAGNOSIS — R410 Disorientation, unspecified: Secondary | ICD-10-CM | POA: Diagnosis not present

## 2019-08-25 DIAGNOSIS — I5022 Chronic systolic (congestive) heart failure: Secondary | ICD-10-CM | POA: Diagnosis not present

## 2019-08-25 DIAGNOSIS — I4891 Unspecified atrial fibrillation: Secondary | ICD-10-CM | POA: Diagnosis not present

## 2019-08-30 ENCOUNTER — Ambulatory Visit (INDEPENDENT_AMBULATORY_CARE_PROVIDER_SITE_OTHER): Payer: Medicare Other | Admitting: *Deleted

## 2019-08-30 DIAGNOSIS — Z95810 Presence of automatic (implantable) cardiac defibrillator: Secondary | ICD-10-CM

## 2019-08-30 LAB — CUP PACEART REMOTE DEVICE CHECK
Battery Remaining Longevity: 22 mo
Battery Remaining Percentage: 25 %
Battery Voltage: 2.81 V
Date Time Interrogation Session: 20210408040022
HighPow Impedance: 70 Ohm
HighPow Impedance: 70 Ohm
Implantable Lead Implant Date: 20150615
Implantable Lead Implant Date: 20150615
Implantable Lead Implant Date: 20150615
Implantable Lead Location: 753858
Implantable Lead Location: 753859
Implantable Lead Location: 753860
Implantable Lead Model: 293
Implantable Lead Serial Number: 342748
Implantable Pulse Generator Implant Date: 20150615
Lead Channel Impedance Value: 450 Ohm
Lead Channel Impedance Value: 450 Ohm
Lead Channel Impedance Value: 790 Ohm
Lead Channel Pacing Threshold Amplitude: 0.5 V
Lead Channel Pacing Threshold Amplitude: 1.25 V
Lead Channel Pacing Threshold Pulse Width: 0.5 ms
Lead Channel Pacing Threshold Pulse Width: 0.7 ms
Lead Channel Sensing Intrinsic Amplitude: 0.3 mV
Lead Channel Sensing Intrinsic Amplitude: 12 mV
Lead Channel Setting Pacing Amplitude: 2.25 V
Lead Channel Setting Pacing Amplitude: 2.5 V
Lead Channel Setting Pacing Pulse Width: 0.5 ms
Lead Channel Setting Pacing Pulse Width: 0.7 ms
Lead Channel Setting Sensing Sensitivity: 0.5 mV
Pulse Gen Serial Number: 7190202

## 2019-08-30 NOTE — Progress Notes (Signed)
ICD Remote  

## 2019-08-31 DIAGNOSIS — I480 Paroxysmal atrial fibrillation: Secondary | ICD-10-CM | POA: Diagnosis not present

## 2019-08-31 DIAGNOSIS — F3341 Major depressive disorder, recurrent, in partial remission: Secondary | ICD-10-CM | POA: Diagnosis not present

## 2019-08-31 DIAGNOSIS — G9340 Encephalopathy, unspecified: Secondary | ICD-10-CM | POA: Diagnosis not present

## 2019-09-10 DIAGNOSIS — Z5181 Encounter for therapeutic drug level monitoring: Secondary | ICD-10-CM | POA: Diagnosis not present

## 2019-09-10 DIAGNOSIS — G8929 Other chronic pain: Secondary | ICD-10-CM | POA: Diagnosis not present

## 2019-09-10 DIAGNOSIS — I480 Paroxysmal atrial fibrillation: Secondary | ICD-10-CM | POA: Diagnosis not present

## 2019-09-10 DIAGNOSIS — M5442 Lumbago with sciatica, left side: Secondary | ICD-10-CM | POA: Diagnosis not present

## 2019-09-10 DIAGNOSIS — Z23 Encounter for immunization: Secondary | ICD-10-CM | POA: Diagnosis not present

## 2019-09-10 DIAGNOSIS — K625 Hemorrhage of anus and rectum: Secondary | ICD-10-CM | POA: Diagnosis not present

## 2019-09-10 DIAGNOSIS — F3341 Major depressive disorder, recurrent, in partial remission: Secondary | ICD-10-CM | POA: Diagnosis not present

## 2019-09-10 DIAGNOSIS — Z79899 Other long term (current) drug therapy: Secondary | ICD-10-CM | POA: Diagnosis not present

## 2019-10-10 DIAGNOSIS — G8929 Other chronic pain: Secondary | ICD-10-CM | POA: Diagnosis not present

## 2019-10-10 DIAGNOSIS — M5442 Lumbago with sciatica, left side: Secondary | ICD-10-CM | POA: Diagnosis not present

## 2019-10-10 DIAGNOSIS — I480 Paroxysmal atrial fibrillation: Secondary | ICD-10-CM | POA: Diagnosis not present

## 2019-10-10 DIAGNOSIS — Z79899 Other long term (current) drug therapy: Secondary | ICD-10-CM | POA: Diagnosis not present

## 2019-10-10 DIAGNOSIS — F3341 Major depressive disorder, recurrent, in partial remission: Secondary | ICD-10-CM | POA: Diagnosis not present

## 2019-10-10 DIAGNOSIS — Z5181 Encounter for therapeutic drug level monitoring: Secondary | ICD-10-CM | POA: Diagnosis not present

## 2019-11-23 ENCOUNTER — Other Ambulatory Visit: Payer: Self-pay | Admitting: Cardiology

## 2019-11-29 ENCOUNTER — Ambulatory Visit (INDEPENDENT_AMBULATORY_CARE_PROVIDER_SITE_OTHER): Payer: Medicare Other | Admitting: *Deleted

## 2019-11-29 DIAGNOSIS — I442 Atrioventricular block, complete: Secondary | ICD-10-CM

## 2019-11-29 LAB — CUP PACEART REMOTE DEVICE CHECK
Battery Remaining Longevity: 19 mo
Battery Remaining Percentage: 22 %
Battery Voltage: 2.8 V
Date Time Interrogation Session: 20210708042307
HighPow Impedance: 69 Ohm
HighPow Impedance: 69 Ohm
Implantable Lead Implant Date: 20150615
Implantable Lead Implant Date: 20150615
Implantable Lead Implant Date: 20150615
Implantable Lead Location: 753858
Implantable Lead Location: 753859
Implantable Lead Location: 753860
Implantable Lead Model: 293
Implantable Lead Serial Number: 342748
Implantable Pulse Generator Implant Date: 20150615
Lead Channel Impedance Value: 440 Ohm
Lead Channel Impedance Value: 450 Ohm
Lead Channel Impedance Value: 900 Ohm
Lead Channel Pacing Threshold Amplitude: 0.5 V
Lead Channel Pacing Threshold Amplitude: 1.5 V
Lead Channel Pacing Threshold Pulse Width: 0.5 ms
Lead Channel Pacing Threshold Pulse Width: 0.7 ms
Lead Channel Sensing Intrinsic Amplitude: 0.3 mV
Lead Channel Sensing Intrinsic Amplitude: 12 mV
Lead Channel Setting Pacing Amplitude: 2.5 V
Lead Channel Setting Pacing Amplitude: 2.5 V
Lead Channel Setting Pacing Pulse Width: 0.5 ms
Lead Channel Setting Pacing Pulse Width: 0.7 ms
Lead Channel Setting Sensing Sensitivity: 0.5 mV
Pulse Gen Serial Number: 7190202

## 2019-12-03 NOTE — Progress Notes (Signed)
Remote ICD transmission.   

## 2019-12-12 DIAGNOSIS — G8929 Other chronic pain: Secondary | ICD-10-CM | POA: Diagnosis not present

## 2019-12-12 DIAGNOSIS — M5442 Lumbago with sciatica, left side: Secondary | ICD-10-CM | POA: Diagnosis not present

## 2020-01-14 DIAGNOSIS — G8929 Other chronic pain: Secondary | ICD-10-CM | POA: Diagnosis not present

## 2020-01-14 DIAGNOSIS — M5442 Lumbago with sciatica, left side: Secondary | ICD-10-CM | POA: Diagnosis not present

## 2020-02-12 ENCOUNTER — Other Ambulatory Visit: Payer: Self-pay | Admitting: Cardiology

## 2020-02-18 DIAGNOSIS — Z5181 Encounter for therapeutic drug level monitoring: Secondary | ICD-10-CM | POA: Diagnosis not present

## 2020-02-18 DIAGNOSIS — G8929 Other chronic pain: Secondary | ICD-10-CM | POA: Diagnosis not present

## 2020-02-18 DIAGNOSIS — Z79899 Other long term (current) drug therapy: Secondary | ICD-10-CM | POA: Diagnosis not present

## 2020-02-28 LAB — CUP PACEART REMOTE DEVICE CHECK
Battery Remaining Longevity: 18 mo
Battery Remaining Percentage: 20 %
Battery Voltage: 2.77 V
Date Time Interrogation Session: 20211007040017
HighPow Impedance: 68 Ohm
HighPow Impedance: 68 Ohm
Implantable Lead Implant Date: 20150615
Implantable Lead Implant Date: 20150615
Implantable Lead Implant Date: 20150615
Implantable Lead Location: 753858
Implantable Lead Location: 753859
Implantable Lead Location: 753860
Implantable Lead Model: 293
Implantable Lead Serial Number: 342748
Implantable Pulse Generator Implant Date: 20150615
Lead Channel Impedance Value: 440 Ohm
Lead Channel Impedance Value: 450 Ohm
Lead Channel Impedance Value: 850 Ohm
Lead Channel Pacing Threshold Amplitude: 0.5 V
Lead Channel Pacing Threshold Amplitude: 1.375 V
Lead Channel Pacing Threshold Pulse Width: 0.5 ms
Lead Channel Pacing Threshold Pulse Width: 0.7 ms
Lead Channel Sensing Intrinsic Amplitude: 0.3 mV
Lead Channel Sensing Intrinsic Amplitude: 12 mV
Lead Channel Setting Pacing Amplitude: 2.375
Lead Channel Setting Pacing Amplitude: 2.5 V
Lead Channel Setting Pacing Pulse Width: 0.5 ms
Lead Channel Setting Pacing Pulse Width: 0.7 ms
Lead Channel Setting Sensing Sensitivity: 0.5 mV
Pulse Gen Serial Number: 7190202

## 2020-02-29 ENCOUNTER — Ambulatory Visit (INDEPENDENT_AMBULATORY_CARE_PROVIDER_SITE_OTHER): Payer: Medicare Other

## 2020-02-29 DIAGNOSIS — I442 Atrioventricular block, complete: Secondary | ICD-10-CM | POA: Diagnosis not present

## 2020-03-04 NOTE — Progress Notes (Signed)
Remote ICD transmission.   

## 2020-03-09 DIAGNOSIS — J449 Chronic obstructive pulmonary disease, unspecified: Secondary | ICD-10-CM | POA: Diagnosis not present

## 2020-03-09 DIAGNOSIS — M79605 Pain in left leg: Secondary | ICD-10-CM | POA: Diagnosis not present

## 2020-03-09 DIAGNOSIS — F419 Anxiety disorder, unspecified: Secondary | ICD-10-CM | POA: Diagnosis present

## 2020-03-09 DIAGNOSIS — I340 Nonrheumatic mitral (valve) insufficiency: Secondary | ICD-10-CM | POA: Diagnosis not present

## 2020-03-09 DIAGNOSIS — Z905 Acquired absence of kidney: Secondary | ICD-10-CM | POA: Diagnosis not present

## 2020-03-09 DIAGNOSIS — G8929 Other chronic pain: Secondary | ICD-10-CM | POA: Diagnosis present

## 2020-03-09 DIAGNOSIS — Z23 Encounter for immunization: Secondary | ICD-10-CM | POA: Diagnosis not present

## 2020-03-09 DIAGNOSIS — I110 Hypertensive heart disease with heart failure: Secondary | ICD-10-CM | POA: Diagnosis present

## 2020-03-09 DIAGNOSIS — Z91041 Radiographic dye allergy status: Secondary | ICD-10-CM | POA: Diagnosis not present

## 2020-03-09 DIAGNOSIS — R600 Localized edema: Secondary | ICD-10-CM | POA: Diagnosis not present

## 2020-03-09 DIAGNOSIS — M79604 Pain in right leg: Secondary | ICD-10-CM | POA: Diagnosis not present

## 2020-03-09 DIAGNOSIS — I4811 Longstanding persistent atrial fibrillation: Secondary | ICD-10-CM | POA: Diagnosis present

## 2020-03-09 DIAGNOSIS — M549 Dorsalgia, unspecified: Secondary | ICD-10-CM | POA: Diagnosis not present

## 2020-03-09 DIAGNOSIS — Z951 Presence of aortocoronary bypass graft: Secondary | ICD-10-CM | POA: Diagnosis not present

## 2020-03-09 DIAGNOSIS — F32A Depression, unspecified: Secondary | ICD-10-CM | POA: Diagnosis present

## 2020-03-09 DIAGNOSIS — R609 Edema, unspecified: Secondary | ICD-10-CM | POA: Diagnosis not present

## 2020-03-09 DIAGNOSIS — Z043 Encounter for examination and observation following other accident: Secondary | ICD-10-CM | POA: Diagnosis not present

## 2020-03-09 DIAGNOSIS — R4182 Altered mental status, unspecified: Secondary | ICD-10-CM | POA: Diagnosis not present

## 2020-03-09 DIAGNOSIS — I361 Nonrheumatic tricuspid (valve) insufficiency: Secondary | ICD-10-CM | POA: Diagnosis not present

## 2020-03-09 DIAGNOSIS — I2510 Atherosclerotic heart disease of native coronary artery without angina pectoris: Secondary | ICD-10-CM | POA: Diagnosis not present

## 2020-03-09 DIAGNOSIS — M1990 Unspecified osteoarthritis, unspecified site: Secondary | ICD-10-CM | POA: Diagnosis present

## 2020-03-09 DIAGNOSIS — S022XXA Fracture of nasal bones, initial encounter for closed fracture: Secondary | ICD-10-CM | POA: Diagnosis not present

## 2020-03-09 DIAGNOSIS — I517 Cardiomegaly: Secondary | ICD-10-CM | POA: Diagnosis not present

## 2020-03-09 DIAGNOSIS — I5022 Chronic systolic (congestive) heart failure: Secondary | ICD-10-CM | POA: Diagnosis present

## 2020-03-09 DIAGNOSIS — Z7902 Long term (current) use of antithrombotics/antiplatelets: Secondary | ICD-10-CM | POA: Diagnosis not present

## 2020-03-09 DIAGNOSIS — R410 Disorientation, unspecified: Secondary | ICD-10-CM | POA: Diagnosis present

## 2020-03-09 DIAGNOSIS — R456 Violent behavior: Secondary | ICD-10-CM | POA: Diagnosis not present

## 2020-03-09 DIAGNOSIS — I10 Essential (primary) hypertension: Secondary | ICD-10-CM | POA: Diagnosis not present

## 2020-03-09 DIAGNOSIS — I69934 Monoplegia of upper limb following unspecified cerebrovascular disease affecting left non-dominant side: Secondary | ICD-10-CM | POA: Diagnosis not present

## 2020-03-09 DIAGNOSIS — E7800 Pure hypercholesterolemia, unspecified: Secondary | ICD-10-CM | POA: Diagnosis present

## 2020-03-09 DIAGNOSIS — R7989 Other specified abnormal findings of blood chemistry: Secondary | ICD-10-CM | POA: Diagnosis not present

## 2020-03-09 DIAGNOSIS — Z95810 Presence of automatic (implantable) cardiac defibrillator: Secondary | ICD-10-CM | POA: Diagnosis not present

## 2020-03-09 DIAGNOSIS — Z79899 Other long term (current) drug therapy: Secondary | ICD-10-CM | POA: Diagnosis not present

## 2020-03-09 DIAGNOSIS — I252 Old myocardial infarction: Secondary | ICD-10-CM | POA: Diagnosis not present

## 2020-03-09 DIAGNOSIS — R55 Syncope and collapse: Secondary | ICD-10-CM | POA: Diagnosis not present

## 2020-03-17 DIAGNOSIS — Z0000 Encounter for general adult medical examination without abnormal findings: Secondary | ICD-10-CM | POA: Diagnosis not present

## 2020-03-17 DIAGNOSIS — Z1389 Encounter for screening for other disorder: Secondary | ICD-10-CM | POA: Diagnosis not present

## 2020-03-17 DIAGNOSIS — Z6824 Body mass index (BMI) 24.0-24.9, adult: Secondary | ICD-10-CM | POA: Diagnosis not present

## 2020-03-17 DIAGNOSIS — E782 Mixed hyperlipidemia: Secondary | ICD-10-CM | POA: Diagnosis not present

## 2020-03-17 DIAGNOSIS — I10 Essential (primary) hypertension: Secondary | ICD-10-CM | POA: Diagnosis not present

## 2020-03-17 DIAGNOSIS — I2510 Atherosclerotic heart disease of native coronary artery without angina pectoris: Secondary | ICD-10-CM | POA: Diagnosis not present

## 2020-04-11 DIAGNOSIS — I25810 Atherosclerosis of coronary artery bypass graft(s) without angina pectoris: Secondary | ICD-10-CM | POA: Diagnosis not present

## 2020-04-11 DIAGNOSIS — S022XXD Fracture of nasal bones, subsequent encounter for fracture with routine healing: Secondary | ICD-10-CM | POA: Diagnosis not present

## 2020-04-11 DIAGNOSIS — I480 Paroxysmal atrial fibrillation: Secondary | ICD-10-CM | POA: Diagnosis not present

## 2020-04-11 DIAGNOSIS — R55 Syncope and collapse: Secondary | ICD-10-CM | POA: Diagnosis not present

## 2020-04-26 DIAGNOSIS — R079 Chest pain, unspecified: Secondary | ICD-10-CM | POA: Diagnosis not present

## 2020-04-26 DIAGNOSIS — R456 Violent behavior: Secondary | ICD-10-CM | POA: Diagnosis not present

## 2020-04-26 DIAGNOSIS — R4182 Altered mental status, unspecified: Secondary | ICD-10-CM | POA: Diagnosis not present

## 2020-04-26 DIAGNOSIS — R404 Transient alteration of awareness: Secondary | ICD-10-CM | POA: Diagnosis not present

## 2020-04-26 DIAGNOSIS — S0990XA Unspecified injury of head, initial encounter: Secondary | ICD-10-CM | POA: Diagnosis not present

## 2020-04-26 DIAGNOSIS — Z8781 Personal history of (healed) traumatic fracture: Secondary | ICD-10-CM | POA: Diagnosis not present

## 2020-04-26 DIAGNOSIS — I4811 Longstanding persistent atrial fibrillation: Secondary | ICD-10-CM | POA: Diagnosis not present

## 2020-04-26 DIAGNOSIS — I5022 Chronic systolic (congestive) heart failure: Secondary | ICD-10-CM | POA: Diagnosis not present

## 2020-04-26 DIAGNOSIS — R569 Unspecified convulsions: Secondary | ICD-10-CM | POA: Diagnosis not present

## 2020-04-26 DIAGNOSIS — R410 Disorientation, unspecified: Secondary | ICD-10-CM | POA: Diagnosis not present

## 2020-04-27 DIAGNOSIS — I4811 Longstanding persistent atrial fibrillation: Secondary | ICD-10-CM | POA: Diagnosis not present

## 2020-04-27 DIAGNOSIS — R569 Unspecified convulsions: Secondary | ICD-10-CM | POA: Diagnosis not present

## 2020-04-27 DIAGNOSIS — I5022 Chronic systolic (congestive) heart failure: Secondary | ICD-10-CM | POA: Diagnosis not present

## 2020-04-28 DIAGNOSIS — E785 Hyperlipidemia, unspecified: Secondary | ICD-10-CM | POA: Diagnosis present

## 2020-04-28 DIAGNOSIS — Z8673 Personal history of transient ischemic attack (TIA), and cerebral infarction without residual deficits: Secondary | ICD-10-CM | POA: Diagnosis not present

## 2020-04-28 DIAGNOSIS — Z7902 Long term (current) use of antithrombotics/antiplatelets: Secondary | ICD-10-CM | POA: Diagnosis not present

## 2020-04-28 DIAGNOSIS — I5022 Chronic systolic (congestive) heart failure: Secondary | ICD-10-CM | POA: Diagnosis not present

## 2020-04-28 DIAGNOSIS — F32A Depression, unspecified: Secondary | ICD-10-CM | POA: Diagnosis present

## 2020-04-28 DIAGNOSIS — J449 Chronic obstructive pulmonary disease, unspecified: Secondary | ICD-10-CM | POA: Diagnosis not present

## 2020-04-28 DIAGNOSIS — F419 Anxiety disorder, unspecified: Secondary | ICD-10-CM | POA: Diagnosis present

## 2020-04-28 DIAGNOSIS — R55 Syncope and collapse: Secondary | ICD-10-CM | POA: Diagnosis not present

## 2020-04-28 DIAGNOSIS — Z951 Presence of aortocoronary bypass graft: Secondary | ICD-10-CM | POA: Diagnosis not present

## 2020-04-28 DIAGNOSIS — I252 Old myocardial infarction: Secondary | ICD-10-CM | POA: Diagnosis not present

## 2020-04-28 DIAGNOSIS — Z79899 Other long term (current) drug therapy: Secondary | ICD-10-CM | POA: Diagnosis not present

## 2020-04-28 DIAGNOSIS — Z905 Acquired absence of kidney: Secondary | ICD-10-CM | POA: Diagnosis not present

## 2020-04-28 DIAGNOSIS — I2510 Atherosclerotic heart disease of native coronary artery without angina pectoris: Secondary | ICD-10-CM | POA: Diagnosis present

## 2020-04-28 DIAGNOSIS — Z95810 Presence of automatic (implantable) cardiac defibrillator: Secondary | ICD-10-CM | POA: Diagnosis not present

## 2020-04-28 DIAGNOSIS — R197 Diarrhea, unspecified: Secondary | ICD-10-CM | POA: Diagnosis present

## 2020-04-28 DIAGNOSIS — R569 Unspecified convulsions: Secondary | ICD-10-CM | POA: Diagnosis not present

## 2020-04-28 DIAGNOSIS — I110 Hypertensive heart disease with heart failure: Secondary | ICD-10-CM | POA: Diagnosis present

## 2020-04-28 DIAGNOSIS — I4811 Longstanding persistent atrial fibrillation: Secondary | ICD-10-CM | POA: Diagnosis not present

## 2020-05-07 ENCOUNTER — Other Ambulatory Visit: Payer: Self-pay | Admitting: Cardiology

## 2020-05-10 DIAGNOSIS — R9401 Abnormal electroencephalogram [EEG]: Secondary | ICD-10-CM | POA: Diagnosis not present

## 2020-05-10 DIAGNOSIS — Z8673 Personal history of transient ischemic attack (TIA), and cerebral infarction without residual deficits: Secondary | ICD-10-CM | POA: Diagnosis not present

## 2020-05-10 DIAGNOSIS — R4020 Unspecified coma: Secondary | ICD-10-CM | POA: Diagnosis not present

## 2020-05-10 DIAGNOSIS — R0902 Hypoxemia: Secondary | ICD-10-CM | POA: Diagnosis not present

## 2020-05-10 DIAGNOSIS — R569 Unspecified convulsions: Secondary | ICD-10-CM | POA: Diagnosis not present

## 2020-05-10 DIAGNOSIS — R404 Transient alteration of awareness: Secondary | ICD-10-CM | POA: Diagnosis not present

## 2020-05-11 DIAGNOSIS — R404 Transient alteration of awareness: Secondary | ICD-10-CM | POA: Diagnosis not present

## 2020-05-11 DIAGNOSIS — R9401 Abnormal electroencephalogram [EEG]: Secondary | ICD-10-CM | POA: Diagnosis not present

## 2020-05-11 DIAGNOSIS — R569 Unspecified convulsions: Secondary | ICD-10-CM | POA: Diagnosis not present

## 2020-05-11 DIAGNOSIS — I10 Essential (primary) hypertension: Secondary | ICD-10-CM | POA: Diagnosis not present

## 2020-05-11 DIAGNOSIS — R52 Pain, unspecified: Secondary | ICD-10-CM | POA: Diagnosis not present

## 2020-05-11 DIAGNOSIS — D72829 Elevated white blood cell count, unspecified: Secondary | ICD-10-CM | POA: Diagnosis not present

## 2020-05-11 DIAGNOSIS — R0902 Hypoxemia: Secondary | ICD-10-CM | POA: Diagnosis not present

## 2020-05-11 DIAGNOSIS — R509 Fever, unspecified: Secondary | ICD-10-CM | POA: Diagnosis not present

## 2020-05-11 DIAGNOSIS — I4891 Unspecified atrial fibrillation: Secondary | ICD-10-CM | POA: Diagnosis not present

## 2020-05-11 DIAGNOSIS — R0602 Shortness of breath: Secondary | ICD-10-CM | POA: Diagnosis not present

## 2020-05-11 DIAGNOSIS — G8929 Other chronic pain: Secondary | ICD-10-CM | POA: Diagnosis not present

## 2020-05-11 DIAGNOSIS — R410 Disorientation, unspecified: Secondary | ICD-10-CM | POA: Diagnosis not present

## 2020-05-12 DIAGNOSIS — R410 Disorientation, unspecified: Secondary | ICD-10-CM | POA: Diagnosis not present

## 2020-05-12 DIAGNOSIS — R9401 Abnormal electroencephalogram [EEG]: Secondary | ICD-10-CM | POA: Diagnosis not present

## 2020-05-12 DIAGNOSIS — R569 Unspecified convulsions: Secondary | ICD-10-CM | POA: Diagnosis not present

## 2020-05-12 DIAGNOSIS — I082 Rheumatic disorders of both aortic and tricuspid valves: Secondary | ICD-10-CM | POA: Diagnosis not present

## 2020-05-13 DIAGNOSIS — R410 Disorientation, unspecified: Secondary | ICD-10-CM | POA: Diagnosis not present

## 2020-05-13 DIAGNOSIS — G009 Bacterial meningitis, unspecified: Secondary | ICD-10-CM | POA: Diagnosis not present

## 2020-05-13 DIAGNOSIS — Z8659 Personal history of other mental and behavioral disorders: Secondary | ICD-10-CM | POA: Diagnosis not present

## 2020-05-13 DIAGNOSIS — R569 Unspecified convulsions: Secondary | ICD-10-CM | POA: Diagnosis not present

## 2020-05-13 DIAGNOSIS — R9401 Abnormal electroencephalogram [EEG]: Secondary | ICD-10-CM | POA: Diagnosis not present

## 2020-05-14 DIAGNOSIS — R569 Unspecified convulsions: Secondary | ICD-10-CM | POA: Diagnosis not present

## 2020-05-14 DIAGNOSIS — G039 Meningitis, unspecified: Secondary | ICD-10-CM | POA: Diagnosis not present

## 2020-05-15 DIAGNOSIS — R2681 Unsteadiness on feet: Secondary | ICD-10-CM | POA: Diagnosis not present

## 2020-05-15 DIAGNOSIS — R451 Restlessness and agitation: Secondary | ICD-10-CM | POA: Diagnosis not present

## 2020-05-15 DIAGNOSIS — G039 Meningitis, unspecified: Secondary | ICD-10-CM | POA: Diagnosis not present

## 2020-05-15 DIAGNOSIS — R410 Disorientation, unspecified: Secondary | ICD-10-CM | POA: Diagnosis not present

## 2020-05-15 DIAGNOSIS — Z8679 Personal history of other diseases of the circulatory system: Secondary | ICD-10-CM | POA: Diagnosis not present

## 2020-05-15 DIAGNOSIS — R569 Unspecified convulsions: Secondary | ICD-10-CM | POA: Diagnosis not present

## 2020-05-15 DIAGNOSIS — Z8659 Personal history of other mental and behavioral disorders: Secondary | ICD-10-CM | POA: Diagnosis not present

## 2020-05-15 DIAGNOSIS — G40909 Epilepsy, unspecified, not intractable, without status epilepticus: Secondary | ICD-10-CM | POA: Diagnosis not present

## 2020-05-15 DIAGNOSIS — R531 Weakness: Secondary | ICD-10-CM | POA: Diagnosis not present

## 2020-05-15 DIAGNOSIS — G3184 Mild cognitive impairment, so stated: Secondary | ICD-10-CM | POA: Diagnosis not present

## 2020-05-15 DIAGNOSIS — G9389 Other specified disorders of brain: Secondary | ICD-10-CM | POA: Diagnosis not present

## 2020-05-17 DIAGNOSIS — M7989 Other specified soft tissue disorders: Secondary | ICD-10-CM | POA: Diagnosis not present

## 2020-05-17 DIAGNOSIS — Z043 Encounter for examination and observation following other accident: Secondary | ICD-10-CM | POA: Diagnosis not present

## 2020-05-18 DIAGNOSIS — G9340 Encephalopathy, unspecified: Secondary | ICD-10-CM | POA: Diagnosis not present

## 2020-05-18 DIAGNOSIS — I517 Cardiomegaly: Secondary | ICD-10-CM | POA: Diagnosis not present

## 2020-05-18 DIAGNOSIS — R569 Unspecified convulsions: Secondary | ICD-10-CM | POA: Diagnosis not present

## 2020-05-18 DIAGNOSIS — F39 Unspecified mood [affective] disorder: Secondary | ICD-10-CM | POA: Diagnosis not present

## 2020-05-18 DIAGNOSIS — D72829 Elevated white blood cell count, unspecified: Secondary | ICD-10-CM | POA: Diagnosis not present

## 2020-05-18 DIAGNOSIS — Z950 Presence of cardiac pacemaker: Secondary | ICD-10-CM | POA: Diagnosis not present

## 2020-05-18 DIAGNOSIS — M79602 Pain in left arm: Secondary | ICD-10-CM | POA: Diagnosis not present

## 2020-05-18 DIAGNOSIS — N189 Chronic kidney disease, unspecified: Secondary | ICD-10-CM | POA: Diagnosis not present

## 2020-05-19 ENCOUNTER — Other Ambulatory Visit: Payer: Self-pay | Admitting: Cardiology

## 2020-05-19 DIAGNOSIS — G9340 Encephalopathy, unspecified: Secondary | ICD-10-CM | POA: Diagnosis not present

## 2020-05-19 DIAGNOSIS — R110 Nausea: Secondary | ICD-10-CM | POA: Diagnosis not present

## 2020-05-19 DIAGNOSIS — D72829 Elevated white blood cell count, unspecified: Secondary | ICD-10-CM | POA: Diagnosis not present

## 2020-05-19 DIAGNOSIS — N189 Chronic kidney disease, unspecified: Secondary | ICD-10-CM | POA: Diagnosis not present

## 2020-05-19 DIAGNOSIS — R4586 Emotional lability: Secondary | ICD-10-CM | POA: Diagnosis not present

## 2020-05-19 DIAGNOSIS — M79602 Pain in left arm: Secondary | ICD-10-CM | POA: Diagnosis not present

## 2020-05-19 DIAGNOSIS — R569 Unspecified convulsions: Secondary | ICD-10-CM | POA: Diagnosis not present

## 2020-05-20 DIAGNOSIS — G9340 Encephalopathy, unspecified: Secondary | ICD-10-CM | POA: Diagnosis not present

## 2020-05-20 DIAGNOSIS — R569 Unspecified convulsions: Secondary | ICD-10-CM | POA: Diagnosis not present

## 2020-05-20 DIAGNOSIS — M79602 Pain in left arm: Secondary | ICD-10-CM | POA: Diagnosis not present

## 2020-05-20 DIAGNOSIS — N189 Chronic kidney disease, unspecified: Secondary | ICD-10-CM | POA: Diagnosis not present

## 2020-05-20 DIAGNOSIS — D72829 Elevated white blood cell count, unspecified: Secondary | ICD-10-CM | POA: Diagnosis not present

## 2020-05-20 DIAGNOSIS — R4586 Emotional lability: Secondary | ICD-10-CM | POA: Diagnosis not present

## 2020-05-21 DIAGNOSIS — G9340 Encephalopathy, unspecified: Secondary | ICD-10-CM | POA: Diagnosis not present

## 2020-05-21 DIAGNOSIS — M79602 Pain in left arm: Secondary | ICD-10-CM | POA: Diagnosis not present

## 2020-05-21 DIAGNOSIS — D72829 Elevated white blood cell count, unspecified: Secondary | ICD-10-CM | POA: Diagnosis not present

## 2020-05-21 DIAGNOSIS — R569 Unspecified convulsions: Secondary | ICD-10-CM | POA: Diagnosis not present

## 2020-05-21 DIAGNOSIS — N189 Chronic kidney disease, unspecified: Secondary | ICD-10-CM | POA: Diagnosis not present

## 2020-05-21 DIAGNOSIS — R4586 Emotional lability: Secondary | ICD-10-CM | POA: Diagnosis not present

## 2020-05-29 ENCOUNTER — Other Ambulatory Visit: Payer: Self-pay | Admitting: Cardiology

## 2020-05-30 ENCOUNTER — Ambulatory Visit (INDEPENDENT_AMBULATORY_CARE_PROVIDER_SITE_OTHER): Payer: Medicare Other

## 2020-05-30 DIAGNOSIS — I442 Atrioventricular block, complete: Secondary | ICD-10-CM

## 2020-05-31 LAB — CUP PACEART REMOTE DEVICE CHECK
Battery Remaining Longevity: 12 mo
Battery Remaining Percentage: 14 %
Battery Voltage: 2.72 V
Date Time Interrogation Session: 20220107020015
HighPow Impedance: 55 Ohm
HighPow Impedance: 55 Ohm
Implantable Lead Implant Date: 20150615
Implantable Lead Implant Date: 20150615
Implantable Lead Implant Date: 20150615
Implantable Lead Location: 753858
Implantable Lead Location: 753859
Implantable Lead Location: 753860
Implantable Lead Model: 293
Implantable Lead Serial Number: 342748
Implantable Pulse Generator Implant Date: 20150615
Lead Channel Impedance Value: 390 Ohm
Lead Channel Impedance Value: 400 Ohm
Lead Channel Impedance Value: 750 Ohm
Lead Channel Pacing Threshold Amplitude: 0.75 V
Lead Channel Pacing Threshold Amplitude: 1.375 V
Lead Channel Pacing Threshold Pulse Width: 0.5 ms
Lead Channel Pacing Threshold Pulse Width: 0.7 ms
Lead Channel Sensing Intrinsic Amplitude: 0.5 mV
Lead Channel Sensing Intrinsic Amplitude: 12 mV
Lead Channel Setting Pacing Amplitude: 2.375
Lead Channel Setting Pacing Amplitude: 2.5 V
Lead Channel Setting Pacing Pulse Width: 0.5 ms
Lead Channel Setting Pacing Pulse Width: 0.7 ms
Lead Channel Setting Sensing Sensitivity: 0.5 mV
Pulse Gen Serial Number: 7190202

## 2020-06-02 ENCOUNTER — Telehealth: Payer: Self-pay

## 2020-06-02 NOTE — Telephone Encounter (Signed)
LVM for pt (DPR in file)  Remote frequency updated to monthly due to battery life within 1 year until ERI.  Next scheduled check 07/02/20.

## 2020-06-13 NOTE — Progress Notes (Signed)
Remote ICD transmission.   

## 2020-06-14 ENCOUNTER — Emergency Department (HOSPITAL_COMMUNITY): Payer: Medicare Other

## 2020-06-14 ENCOUNTER — Encounter (HOSPITAL_COMMUNITY): Payer: Self-pay | Admitting: Emergency Medicine

## 2020-06-14 ENCOUNTER — Emergency Department (HOSPITAL_COMMUNITY)
Admission: EM | Admit: 2020-06-14 | Discharge: 2020-06-15 | Disposition: A | Discharge status: Home / Self Care | Payer: Medicare Other | Attending: Emergency Medicine | Admitting: Emergency Medicine

## 2020-06-14 DIAGNOSIS — I959 Hypotension, unspecified: Secondary | ICD-10-CM | POA: Diagnosis not present

## 2020-06-14 DIAGNOSIS — Z95810 Presence of automatic (implantable) cardiac defibrillator: Secondary | ICD-10-CM | POA: Diagnosis not present

## 2020-06-14 DIAGNOSIS — R197 Diarrhea, unspecified: Secondary | ICD-10-CM | POA: Diagnosis not present

## 2020-06-14 DIAGNOSIS — F17210 Nicotine dependence, cigarettes, uncomplicated: Secondary | ICD-10-CM | POA: Diagnosis not present

## 2020-06-14 DIAGNOSIS — Z7901 Long term (current) use of anticoagulants: Secondary | ICD-10-CM | POA: Insufficient documentation

## 2020-06-14 DIAGNOSIS — R569 Unspecified convulsions: Secondary | ICD-10-CM | POA: Insufficient documentation

## 2020-06-14 DIAGNOSIS — Z20822 Contact with and (suspected) exposure to covid-19: Secondary | ICD-10-CM | POA: Diagnosis not present

## 2020-06-14 DIAGNOSIS — I509 Heart failure, unspecified: Secondary | ICD-10-CM | POA: Diagnosis not present

## 2020-06-14 DIAGNOSIS — R4182 Altered mental status, unspecified: Secondary | ICD-10-CM | POA: Diagnosis not present

## 2020-06-14 DIAGNOSIS — R0902 Hypoxemia: Secondary | ICD-10-CM | POA: Diagnosis not present

## 2020-06-14 DIAGNOSIS — N183 Chronic kidney disease, stage 3 unspecified: Secondary | ICD-10-CM | POA: Diagnosis not present

## 2020-06-14 DIAGNOSIS — R404 Transient alteration of awareness: Secondary | ICD-10-CM | POA: Diagnosis not present

## 2020-06-14 DIAGNOSIS — I4892 Unspecified atrial flutter: Secondary | ICD-10-CM | POA: Diagnosis not present

## 2020-06-14 DIAGNOSIS — R112 Nausea with vomiting, unspecified: Secondary | ICD-10-CM

## 2020-06-14 LAB — COMPREHENSIVE METABOLIC PANEL
ALT: 16 U/L (ref 0–44)
AST: 27 U/L (ref 15–41)
Albumin: 3.4 g/dL — ABNORMAL LOW (ref 3.5–5.0)
Alkaline Phosphatase: 76 U/L (ref 38–126)
Anion gap: 12 (ref 5–15)
BUN: 14 mg/dL (ref 6–20)
CO2: 25 mmol/L (ref 22–32)
Calcium: 8.9 mg/dL (ref 8.9–10.3)
Chloride: 100 mmol/L (ref 98–111)
Creatinine, Ser: 1.42 mg/dL — ABNORMAL HIGH (ref 0.61–1.24)
GFR, Estimated: 57 mL/min — ABNORMAL LOW (ref >60)
Glucose, Bld: 82 mg/dL (ref 70–99)
Potassium: 4.5 mmol/L (ref 3.5–5.1)
Sodium: 137 mmol/L (ref 135–145)
Total Bilirubin: 0.9 mg/dL (ref 0.3–1.2)
Total Protein: 7.2 g/dL (ref 6.5–8.1)

## 2020-06-14 LAB — URINALYSIS, ROUTINE W REFLEX MICROSCOPIC
Bilirubin Urine: NEGATIVE
Glucose, UA: NEGATIVE mg/dL
Hgb urine dipstick: NEGATIVE
Ketones, ur: NEGATIVE mg/dL
Leukocytes,Ua: NEGATIVE
Nitrite: NEGATIVE
Protein, ur: NEGATIVE mg/dL
Specific Gravity, Urine: 1.006 (ref 1.005–1.030)
pH: 8 (ref 5.0–8.0)

## 2020-06-14 LAB — ETHANOL: Alcohol, Ethyl (B): <10 mg/dL (ref <10)

## 2020-06-14 LAB — I-STAT CHEM 8, ED
BUN: 17 mg/dL (ref 6–20)
Calcium, Ion: 1.13 mmol/L — ABNORMAL LOW (ref 1.15–1.40)
Chloride: 99 mmol/L (ref 98–111)
Creatinine, Ser: 1.2 mg/dL (ref 0.61–1.24)
Glucose, Bld: 83 mg/dL (ref 70–99)
HCT: 39 % (ref 39.0–52.0)
Hemoglobin: 13.3 g/dL (ref 13.0–17.0)
Potassium: 4.4 mmol/L (ref 3.5–5.1)
Sodium: 138 mmol/L (ref 135–145)
TCO2: 27 mmol/L (ref 22–32)

## 2020-06-14 LAB — RAPID URINE DRUG SCREEN, HOSP PERFORMED
Amphetamines: NOT DETECTED
Barbiturates: NOT DETECTED
Benzodiazepines: NOT DETECTED
Cocaine: NOT DETECTED
Opiates: NOT DETECTED
Tetrahydrocannabinol: POSITIVE — AB

## 2020-06-14 LAB — CBG MONITORING, ED: Glucose-Capillary: 156 mg/dL — ABNORMAL HIGH (ref 70–99)

## 2020-06-14 LAB — DIGOXIN LEVEL: Digoxin Level: 0.3 ng/mL — ABNORMAL LOW (ref 0.8–2.0)

## 2020-06-14 LAB — VALPROIC ACID LEVEL: Valproic Acid Lvl: <10 ug/mL — ABNORMAL LOW (ref 50.0–100.0)

## 2020-06-14 MED ORDER — SODIUM CHLORIDE 0.9 % IV BOLUS: 250.0000 mL | Freq: Once | INTRAVENOUS | Status: DC

## 2020-06-14 MED ORDER — NALOXONE HCL 2 MG/2ML IJ SOSY
1.0000 mg | PREFILLED_SYRINGE | Freq: Once | INTRAMUSCULAR | Status: AC
  Administered 2020-06-14: 1 mg via INTRAVENOUS

## 2020-06-14 MED ORDER — LEVETIRACETAM IN NACL 1500 MG/100ML IV SOLN
1500.0000 mg | Freq: Once | INTRAVENOUS | Status: AC
  Administered 2020-06-14: 1500 mg via INTRAVENOUS
  Filled 2020-06-14: qty 100

## 2020-06-14 MED ORDER — LORAZEPAM 2 MG/ML IJ SOLN
1.0000 mg | Freq: Once | INTRAMUSCULAR | Status: AC
  Administered 2020-06-14: 1 mg via INTRAVENOUS
  Filled 2020-06-14: qty 1

## 2020-06-14 NOTE — Progress Notes (Signed)
Pt awake, breathing WNL but altered and actively trying to climb out of bed. Pt does not know time and place at this time. No acute distress noted, pt is able to cough. Nasal trumpet has been removed.

## 2020-06-14 NOTE — ED Notes (Signed)
Pt to CT

## 2020-06-14 NOTE — ED Provider Notes (Signed)
[facility] EMERGENCY DEPARTMENT Provider Note   CSN: [id number] Arrival date & time: 06/14/20  1246     History Chief Complaint  Patient presents with  . Altered Mental Status  . Seizures    Carlos Wright is a 60 y.o. male.  Patient arrives via EMS with report that coworkers found patient with altered mental status and jerking movements of body. Per EMS report, pt was awake and alert, with cbg normal, was mildly altered but able to answer questions, and indicated he had snorted oxy earlier. Pt limited historian, seizing - level caveat.  On arrival to ED/room, patient was unresponsive, seizing.   The history is provided by the patient and the EMS personnel. The history is limited by the condition of the patient.  Altered Mental Status      History reviewed. No pertinent past medical history.  Patient Active Problem List   Diagnosis Date Noted  . Biventricular implantable cardioverter-defibrillator in situ 12/05/2014  . Complete AV block (HCC) 12/05/2014  . Old myocardial infarct 12/05/2014  . Congestive heart failure (CHF) (HCC) 11/12/2013  . CKD (chronic kidney disease) stage 3, GFR 30-59 ml/min (HCC) 11/09/2013  . Long term (current) use of anticoagulants 11/09/2013  . AKI (acute kidney injury) (HCC) 11/03/2013  . Acute arterial ischemic stroke, multifocal, posterior circulation (HCC) 10/29/2013    History reviewed. No pertinent surgical history.     History reviewed. No pertinent family history.  Social History   Tobacco Use  . Smoking status: Current Every Day Smoker    Types: Cigarettes  . Smokeless tobacco: Former Engineer, water Use Topics  . Alcohol use: Yes    Comment: Every once in a while  . Drug use: Never    Home Medications Prior to Admission medications   Medication Sig Start Date End Date Taking? Authorizing Provider  amitriptyline (ELAVIL) 25 MG tablet Take 1 tablet by mouth at bedtime. 10/25/18   [provider]   carvedilol (COREG) 3.125 MG tablet TAKE 1 TABLET BY MOUTH TWICE DAILY WITH FOOD * *MUST HAVE APPT BEFORE MORE REFILLS* * 05/29/20   Georgeanna Lea, MD  digoxin (LANOXIN) 0.25 MG tablet Take 0.25 mg by mouth daily.    [provider]  DULoxetine (CYMBALTA) 30 MG capsule Take 1 capsule by mouth daily. 10/25/18   [provider]  fluticasone (FLONASE) 50 MCG/ACT nasal spray Place 1 spray into both nostrils daily.    [provider]  furosemide (LASIX) 20 MG tablet Take 2 tablets by mouth daily. 11/12/13   [provider]  gabapentin (NEURONTIN) 600 MG tablet Take 1 tablet by mouth 3 (three) times daily. 11/16/18   [provider]  Melatonin 5 MG TABS Take by mouth at bedtime as needed.    [provider]  meloxicam (MOBIC) 7.5 MG tablet Take 7.5 mg by mouth daily.    [provider]  methocarbamol (ROBAXIN) 500 MG tablet Take 500 mg by mouth 3 (three) times daily.     [provider]  naloxegol oxalate (MOVANTIK) 25 MG TABS tablet Take 25 mg by mouth daily.    [provider]  Oxycodone HCl 10 MG TABS TAKE 1 TABLET (10 MG) BY ORAL ROUTE EVERY 6 HOURS PRN 11/10/18   [provider]  rosuvastatin (CRESTOR) 40 MG tablet Take 1 tablet by mouth daily. 11/17/18   [provider]  sildenafil (VIAGRA) 50 MG tablet Take 50 mg by mouth daily as needed for erectile dysfunction.  [provider]  XARELTO 15 MG TABS tablet Take 1 tablet by mouth daily. 11/17/18   [provider]    Allergies    Iodinated diagnostic agents  Review of Systems   Review of Systems  Unable to perform ROS: Mental status change  Level 5 caveat, altered mental status, seizing, unresponsive.    Physical Exam Updated Vital Signs BP 107/61 (BP Location: Left Arm)   Pulse 63   Temp 98.6 F (37 C) (Rectal)   Resp 14   SpO2 99%   Physical Exam Vitals and nursing note reviewed.  Constitutional:      Appearance:  Normal appearance. He is well-developed.  HENT:     Head: Atraumatic.     Nose: Nose normal.     Mouth/Throat:     Mouth: Mucous membranes are moist.     Pharynx: Oropharynx is clear.  Eyes:     General: No scleral icterus.    Conjunctiva/sclera: Conjunctivae normal.     Pupils: Pupils are equal, round, and reactive to light.  Neck:     Vascular: No carotid bruit.     Trachea: No tracheal deviation.     Comments: No stiffness or rigidity Cardiovascular:     Rate and Rhythm: Normal rate and regular rhythm.     Pulses: Normal pulses.     Heart sounds: Normal heart sounds. No murmur heard. No friction rub. No gallop.   Pulmonary:     Effort: Pulmonary effort is normal. No accessory muscle usage or respiratory distress.     Breath sounds: Normal breath sounds.  Abdominal:     General: Bowel sounds are normal. There is no distension.     Palpations: Abdomen is soft.     Tenderness: There is no abdominal tenderness. There is no guarding.  Genitourinary:    Comments: No cva tenderness. Musculoskeletal:        General: No swelling.     Cervical back: Normal range of motion and neck supple. No rigidity.     Comments: CTLS spine, non tender, aligned, no step off. Good rom bil extremities without pain or focal bony tenderness.   Skin:    General: Skin is warm and dry.     Findings: No rash.  Neurological:     Mental Status: He is alert.     Comments: Initially seizing, then post-ictal, confused. Pt now is moving bilateral extremities purposefully with good strength.   Psychiatric:     Comments: Altered.       ED Results / Procedures / Treatments   Labs (all labs ordered are listed, but only abnormal results are displayed) Results for orders placed or performed during the hospital encounter of 06/14/20  POC CBG, ED  Result Value Ref Range   Glucose-Capillary 156 (H) 70 - 99 mg/dL   CUP PACEART REMOTE DEVICE CHECK  Result Date: 05/31/2020 Scheduled remote reviewed. Noted  battery longevity at 1 year.  Known perm a fib.  Histogram controlled.  Normal device function.  Forwarding to triage for evaluation follow up on battery since on alert for premature depletion.  R. Powers, LPN, CVRS Next remote 91 days.   EKG EKG Interpretation  Date/Time:  Saturday June 14 2020 13:22:14 EST Ventricular Rate:  61 PR Interval:    QRS Duration: 159 QT Interval:  532 QTC Calculation: 536 R Axis:   -93 Text Interpretation: Afib/flutter and ventricular-paced rhythm No further analysis attempted due to paced rhythm Confirmed by Cathren Laine ([id number]) on 06/14/2020 1:55:28  PM   Radiology No results found.  Procedures Procedures (including critical care time)  Medications Ordered in ED Medications  naloxone (NARCAN) injection 1 mg (1 mg Intravenous Given 06/14/20 1304)  LORazepam (ATIVAN) injection 1 mg (1 mg Intravenous Given 06/14/20 1305)  levETIRAcetam (KEPPRA) IVPB 1500 mg/ 100 mL premix (0 mg Intravenous Stopped 06/14/20 1328)    ED Course  I have reviewed the triage vital signs and the nursing notes.  Pertinent labs & imaging results that were available during my care of the patient were reviewed by me and considered in my medical decision making (see chart for details).    MDM Rules/Calculators/A&P                         Patient is seizing on arrival to room. Iv ns. Continuous pulse ox and cardiac monitoring. Stat cbg and labs. Narcan iv. Ativan iv.   Reviewed nursing notes and prior charts for additional history. Prior admission reviewed - mentions narcotic abuse, etoh abuse, and hx seizures.  Additional labs added.   Ativan, keppra iv. Seizure precautions.  MDM Number of Diagnoses or Management Options   Amount and/or Complexity of Data Reviewed Clinical lab tests: ordered and reviewed Tests in the radiology section of CPT: ordered and reviewed Tests in the medicine section of CPT: ordered and reviewed Discussion of test results with the  performing providers: yes Decide to obtain previous medical records or to obtain history from someone other than the patient: yes Obtain history from someone other than the patient: yes Review and summarize past medical records: yes Discuss the patient with other providers: yes Independent visualization of images, tracings, or specimens: yes  Risk of Complications, Morbidity, and/or Mortality Presenting problems: high Diagnostic procedures: high Management options: high   Labs reviewed/interpreted by me - glucose 156.   CT/imaging - pending.  CRITICAL CARE RE: prolonged seizure, status epilepticus, iv keppra load. Acute alteration in mental status.  Performed by: Suzi Roots Total critical care time: 40 minutes Critical care time was exclusive of separately billable procedures and treating other patients. Critical care was necessary to treat or prevent imminent or life-threatening deterioration. Critical care was time spent personally by me on the following activities: development of treatment plan with patient and/or surrogate as well as nursing, discussions with consultants, evaluation of patient's response to treatment, examination of patient, obtaining history from patient or surrogate, ordering and performing treatments and interventions, ordering and review of laboratory studies, ordering and review of radiographic studies, pulse oximetry and re-evaluation of patient's condition.  1515, pt awake, alert. Still mildly confused, but responding to simple questions, making purposeful movements extremities. Labs and imaging remain pending - signed out to Dr Madilyn Hook to check lab and imaging results, recheck pt, and dispo appropriately.    Final Clinical Impression(s) / ED Diagnoses Final diagnoses:  None    Rx / DC Orders ED Discharge Orders    None       Cathren Laine, MD 06/14/20 1515

## 2020-06-14 NOTE — ED Notes (Signed)
Pt removed from soft restraints, following commands. Answers most orientation questions, does not remember events of today. Pt c/o lower back pain, chronic in nature. Stood to use urinal, unsteady gait, requiring one assist to stand. Diarrhea noted and urinary hesitancy.

## 2020-06-14 NOTE — ED Provider Notes (Signed)
Pt care assumed at 1500.  Pt with hx/o seizure here for evaluation following witnessed seizure.  At time of handoff pt confused but improving, labs and CT head pending.     CT head negative for acute abnormality.  Labs are at his baseline.  On recheck pt is feeling improved, reports one week of N/V/D but compliance with meds.  He has no current complaints and is ambulating to and from the room without difficulty.  D/w pt recurrent seizure.  Given he is at his baseline with no recurrent vomiting, tolerating orals and diarrhea improving plan to d/c home with outpatient resources and return precautions.    Presentation is not c/w CVA, sepsis.     Tilden Fossa, MD 06/15/20 [phone]

## 2020-06-14 NOTE — Progress Notes (Signed)
Size 82mm nasal trumpet inserted into left nare to assist with snoring respirations. Pt unresponsive post seizure. Vital signs WNL, MD at bedside. RRT will continue to monitor for airway protection.

## 2020-06-14 NOTE — ED Triage Notes (Signed)
BIB [facility] after coworkers found him altered and jerking of lower legs. Per EMS pt was A&O x 1, was able to answer questions. Pt has hx of substance abuse and known to snort oxy r/t chronic back pain. PT has hx of bypass, pace maker.

## 2020-06-14 NOTE — ED Notes (Addendum)
Pt disconnected himself from the cardiac monitor. Ambulating around the room with a more steady gait than previously. Frequent trips to the restoom with diarrhea. Pt coughed phlegm onto floor, noted to be dark red, Dr. Madilyn Hook notified . Tolerating ginger ale

## 2020-06-15 LAB — SARS CORONAVIRUS 2 (TAT 6-24 HRS): SARS Coronavirus 2: NEGATIVE

## 2020-06-15 NOTE — ED Notes (Signed)
Updated son on disposition. Obtained phone number for pt's roommate, Gala Romney. Pt's son lives in [place] and roommate does not drive. Pt reports he can attempted to find a ride in the morning

## 2020-06-18 LAB — LEVETIRACETAM LEVEL: Levetiracetam Lvl: 29.7 ug/mL (ref 10.0–40.0)

## 2020-06-24 DIAGNOSIS — I480 Paroxysmal atrial fibrillation: Secondary | ICD-10-CM | POA: Diagnosis not present

## 2020-06-24 DIAGNOSIS — R569 Unspecified convulsions: Secondary | ICD-10-CM | POA: Diagnosis not present

## 2020-06-24 DIAGNOSIS — I25708 Atherosclerosis of coronary artery bypass graft(s), unspecified, with other forms of angina pectoris: Secondary | ICD-10-CM | POA: Diagnosis not present

## 2020-06-24 DIAGNOSIS — M5442 Lumbago with sciatica, left side: Secondary | ICD-10-CM | POA: Diagnosis not present

## 2020-06-24 DIAGNOSIS — G8929 Other chronic pain: Secondary | ICD-10-CM | POA: Diagnosis not present

## 2020-07-01 DIAGNOSIS — G8929 Other chronic pain: Secondary | ICD-10-CM | POA: Diagnosis not present

## 2020-07-01 DIAGNOSIS — Z5181 Encounter for therapeutic drug level monitoring: Secondary | ICD-10-CM | POA: Diagnosis not present

## 2020-07-01 DIAGNOSIS — I5043 Acute on chronic combined systolic (congestive) and diastolic (congestive) heart failure: Secondary | ICD-10-CM | POA: Diagnosis not present

## 2020-07-01 DIAGNOSIS — M5442 Lumbago with sciatica, left side: Secondary | ICD-10-CM | POA: Diagnosis not present

## 2020-07-01 DIAGNOSIS — Z79899 Other long term (current) drug therapy: Secondary | ICD-10-CM | POA: Diagnosis not present

## 2020-07-01 DIAGNOSIS — R3915 Urgency of urination: Secondary | ICD-10-CM | POA: Diagnosis not present

## 2020-07-02 ENCOUNTER — Ambulatory Visit (INDEPENDENT_AMBULATORY_CARE_PROVIDER_SITE_OTHER): Payer: Medicare Other

## 2020-07-02 DIAGNOSIS — I442 Atrioventricular block, complete: Secondary | ICD-10-CM

## 2020-07-03 LAB — CUP PACEART REMOTE DEVICE CHECK
Battery Remaining Longevity: 11 mo
Battery Remaining Percentage: 13 %
Battery Voltage: 2.71 V
Date Time Interrogation Session: 20220209020016
HighPow Impedance: 51 Ohm
HighPow Impedance: 51 Ohm
Implantable Lead Implant Date: 20150615
Implantable Lead Implant Date: 20150615
Implantable Lead Implant Date: 20150615
Implantable Lead Location: 753858
Implantable Lead Location: 753859
Implantable Lead Location: 753860
Implantable Lead Model: 293
Implantable Lead Serial Number: 342748
Implantable Pulse Generator Implant Date: 20150615
Lead Channel Impedance Value: 340 Ohm
Lead Channel Impedance Value: 380 Ohm
Lead Channel Impedance Value: 710 Ohm
Lead Channel Pacing Threshold Amplitude: 0.75 V
Lead Channel Pacing Threshold Amplitude: 1 V
Lead Channel Pacing Threshold Pulse Width: 0.5 ms
Lead Channel Pacing Threshold Pulse Width: 0.7 ms
Lead Channel Sensing Intrinsic Amplitude: 0.3 mV
Lead Channel Sensing Intrinsic Amplitude: 12 mV
Lead Channel Setting Pacing Amplitude: 2 V
Lead Channel Setting Pacing Amplitude: 2.5 V
Lead Channel Setting Pacing Pulse Width: 0.5 ms
Lead Channel Setting Pacing Pulse Width: 0.7 ms
Lead Channel Setting Sensing Sensitivity: 0.5 mV
Pulse Gen Serial Number: 7190202

## 2020-07-07 NOTE — Addendum Note (Signed)
Addended by: Geralyn Flash D on: 07/07/2020 04:12 PM   Modules accepted: Level of Service

## 2020-07-07 NOTE — Progress Notes (Signed)
Remote ICD transmission.   

## 2020-07-24 DIAGNOSIS — R569 Unspecified convulsions: Secondary | ICD-10-CM | POA: Diagnosis not present

## 2020-07-24 DIAGNOSIS — G40901 Epilepsy, unspecified, not intractable, with status epilepticus: Secondary | ICD-10-CM | POA: Diagnosis not present

## 2020-07-24 DIAGNOSIS — Q283 Other malformations of cerebral vessels: Secondary | ICD-10-CM | POA: Diagnosis not present

## 2020-07-24 DIAGNOSIS — R456 Violent behavior: Secondary | ICD-10-CM | POA: Diagnosis not present

## 2020-07-24 DIAGNOSIS — R680 Hypothermia, not associated with low environmental temperature: Secondary | ICD-10-CM | POA: Diagnosis not present

## 2020-07-24 DIAGNOSIS — J690 Pneumonitis due to inhalation of food and vomit: Secondary | ICD-10-CM | POA: Diagnosis not present

## 2020-07-24 DIAGNOSIS — I517 Cardiomegaly: Secondary | ICD-10-CM | POA: Diagnosis not present

## 2020-07-24 DIAGNOSIS — R0902 Hypoxemia: Secondary | ICD-10-CM | POA: Diagnosis not present

## 2020-07-24 DIAGNOSIS — I10 Essential (primary) hypertension: Secondary | ICD-10-CM | POA: Diagnosis not present

## 2020-07-24 DIAGNOSIS — R4182 Altered mental status, unspecified: Secondary | ICD-10-CM | POA: Diagnosis not present

## 2020-07-24 DIAGNOSIS — N179 Acute kidney failure, unspecified: Secondary | ICD-10-CM | POA: Diagnosis not present

## 2020-07-24 DIAGNOSIS — R404 Transient alteration of awareness: Secondary | ICD-10-CM | POA: Diagnosis not present

## 2020-07-24 DIAGNOSIS — Z4682 Encounter for fitting and adjustment of non-vascular catheter: Secondary | ICD-10-CM | POA: Diagnosis not present

## 2020-07-24 DIAGNOSIS — J9690 Respiratory failure, unspecified, unspecified whether with hypoxia or hypercapnia: Secondary | ICD-10-CM | POA: Diagnosis not present

## 2020-07-24 DIAGNOSIS — I6523 Occlusion and stenosis of bilateral carotid arteries: Secondary | ICD-10-CM | POA: Diagnosis not present

## 2020-07-25 DIAGNOSIS — Z4682 Encounter for fitting and adjustment of non-vascular catheter: Secondary | ICD-10-CM | POA: Diagnosis not present

## 2020-07-25 DIAGNOSIS — J690 Pneumonitis due to inhalation of food and vomit: Secondary | ICD-10-CM | POA: Diagnosis not present

## 2020-07-25 DIAGNOSIS — Q283 Other malformations of cerebral vessels: Secondary | ICD-10-CM | POA: Diagnosis not present

## 2020-07-25 DIAGNOSIS — A419 Sepsis, unspecified organism: Secondary | ICD-10-CM | POA: Diagnosis not present

## 2020-07-25 DIAGNOSIS — I6523 Occlusion and stenosis of bilateral carotid arteries: Secondary | ICD-10-CM | POA: Diagnosis not present

## 2020-07-25 DIAGNOSIS — R569 Unspecified convulsions: Secondary | ICD-10-CM | POA: Diagnosis not present

## 2020-07-25 DIAGNOSIS — I5023 Acute on chronic systolic (congestive) heart failure: Secondary | ICD-10-CM | POA: Diagnosis not present

## 2020-07-26 DIAGNOSIS — A419 Sepsis, unspecified organism: Secondary | ICD-10-CM | POA: Diagnosis not present

## 2020-07-26 DIAGNOSIS — J690 Pneumonitis due to inhalation of food and vomit: Secondary | ICD-10-CM | POA: Diagnosis not present

## 2020-07-26 DIAGNOSIS — I5023 Acute on chronic systolic (congestive) heart failure: Secondary | ICD-10-CM | POA: Diagnosis not present

## 2020-07-27 DIAGNOSIS — A419 Sepsis, unspecified organism: Secondary | ICD-10-CM | POA: Diagnosis not present

## 2020-07-27 DIAGNOSIS — J690 Pneumonitis due to inhalation of food and vomit: Secondary | ICD-10-CM | POA: Diagnosis not present

## 2020-07-27 DIAGNOSIS — I5023 Acute on chronic systolic (congestive) heart failure: Secondary | ICD-10-CM | POA: Diagnosis not present

## 2020-07-28 DIAGNOSIS — A419 Sepsis, unspecified organism: Secondary | ICD-10-CM | POA: Diagnosis not present

## 2020-07-28 DIAGNOSIS — J690 Pneumonitis due to inhalation of food and vomit: Secondary | ICD-10-CM | POA: Diagnosis not present

## 2020-07-28 DIAGNOSIS — I5023 Acute on chronic systolic (congestive) heart failure: Secondary | ICD-10-CM | POA: Diagnosis not present

## 2020-07-29 DIAGNOSIS — J690 Pneumonitis due to inhalation of food and vomit: Secondary | ICD-10-CM | POA: Diagnosis not present

## 2020-07-29 DIAGNOSIS — A419 Sepsis, unspecified organism: Secondary | ICD-10-CM | POA: Diagnosis not present

## 2020-07-29 DIAGNOSIS — I5023 Acute on chronic systolic (congestive) heart failure: Secondary | ICD-10-CM | POA: Diagnosis not present

## 2020-07-30 DIAGNOSIS — R569 Unspecified convulsions: Secondary | ICD-10-CM | POA: Diagnosis not present

## 2020-07-30 DIAGNOSIS — I5023 Acute on chronic systolic (congestive) heart failure: Secondary | ICD-10-CM | POA: Diagnosis not present

## 2020-07-30 DIAGNOSIS — J690 Pneumonitis due to inhalation of food and vomit: Secondary | ICD-10-CM | POA: Diagnosis not present

## 2020-07-30 DIAGNOSIS — A419 Sepsis, unspecified organism: Secondary | ICD-10-CM | POA: Diagnosis not present

## 2020-07-31 DIAGNOSIS — I5023 Acute on chronic systolic (congestive) heart failure: Secondary | ICD-10-CM | POA: Diagnosis not present

## 2020-07-31 DIAGNOSIS — A419 Sepsis, unspecified organism: Secondary | ICD-10-CM | POA: Diagnosis not present

## 2020-07-31 DIAGNOSIS — J690 Pneumonitis due to inhalation of food and vomit: Secondary | ICD-10-CM | POA: Diagnosis not present

## 2020-08-01 DIAGNOSIS — R279 Unspecified lack of coordination: Secondary | ICD-10-CM | POA: Diagnosis not present

## 2020-08-01 DIAGNOSIS — I5023 Acute on chronic systolic (congestive) heart failure: Secondary | ICD-10-CM | POA: Diagnosis not present

## 2020-08-01 DIAGNOSIS — J690 Pneumonitis due to inhalation of food and vomit: Secondary | ICD-10-CM | POA: Diagnosis not present

## 2020-08-01 DIAGNOSIS — R5381 Other malaise: Secondary | ICD-10-CM | POA: Diagnosis not present

## 2020-08-01 DIAGNOSIS — A419 Sepsis, unspecified organism: Secondary | ICD-10-CM | POA: Diagnosis not present

## 2020-08-01 DIAGNOSIS — Z743 Need for continuous supervision: Secondary | ICD-10-CM | POA: Diagnosis not present

## 2020-08-02 DIAGNOSIS — I517 Cardiomegaly: Secondary | ICD-10-CM | POA: Diagnosis not present

## 2020-08-02 DIAGNOSIS — R0989 Other specified symptoms and signs involving the circulatory and respiratory systems: Secondary | ICD-10-CM | POA: Diagnosis not present

## 2020-08-29 ENCOUNTER — Ambulatory Visit (INDEPENDENT_AMBULATORY_CARE_PROVIDER_SITE_OTHER): Payer: Medicare Other

## 2020-08-29 DIAGNOSIS — I442 Atrioventricular block, complete: Secondary | ICD-10-CM | POA: Diagnosis not present

## 2020-08-29 LAB — CUP PACEART REMOTE DEVICE CHECK
Battery Remaining Longevity: 10 mo
Battery Remaining Percentage: 11 %
Battery Voltage: 2.69 V
Date Time Interrogation Session: 20220408020017
HighPow Impedance: 51 Ohm
HighPow Impedance: 51 Ohm
Implantable Lead Implant Date: 20150615
Implantable Lead Implant Date: 20150615
Implantable Lead Implant Date: 20150615
Implantable Lead Location: 753858
Implantable Lead Location: 753859
Implantable Lead Location: 753860
Implantable Lead Model: 293
Implantable Lead Serial Number: 342748
Implantable Pulse Generator Implant Date: 20150615
Lead Channel Impedance Value: 300 Ohm
Lead Channel Impedance Value: 340 Ohm
Lead Channel Impedance Value: 650 Ohm
Lead Channel Pacing Threshold Amplitude: 0.75 V
Lead Channel Pacing Threshold Amplitude: 1.125 V
Lead Channel Pacing Threshold Pulse Width: 0.5 ms
Lead Channel Pacing Threshold Pulse Width: 0.7 ms
Lead Channel Sensing Intrinsic Amplitude: 0.3 mV
Lead Channel Sensing Intrinsic Amplitude: 12 mV
Lead Channel Setting Pacing Amplitude: 2.125
Lead Channel Setting Pacing Amplitude: 2.5 V
Lead Channel Setting Pacing Pulse Width: 0.5 ms
Lead Channel Setting Pacing Pulse Width: 0.7 ms
Lead Channel Setting Sensing Sensitivity: 0.5 mV
Pulse Gen Serial Number: 7190202

## 2020-09-12 NOTE — Progress Notes (Signed)
Remote ICD transmission.   

## 2020-10-03 DIAGNOSIS — Z95810 Presence of automatic (implantable) cardiac defibrillator: Secondary | ICD-10-CM | POA: Diagnosis not present

## 2020-10-03 DIAGNOSIS — R569 Unspecified convulsions: Secondary | ICD-10-CM | POA: Diagnosis not present

## 2020-10-03 DIAGNOSIS — I255 Ischemic cardiomyopathy: Secondary | ICD-10-CM | POA: Diagnosis not present

## 2020-10-03 DIAGNOSIS — I25708 Atherosclerosis of coronary artery bypass graft(s), unspecified, with other forms of angina pectoris: Secondary | ICD-10-CM | POA: Diagnosis not present

## 2020-10-03 DIAGNOSIS — I480 Paroxysmal atrial fibrillation: Secondary | ICD-10-CM | POA: Diagnosis not present

## 2020-10-22 DEATH — deceased

## 2022-01-09 IMAGING — CT CT HEAD W/O CM
4 series · 17 of 47 positions shown, 19 images · non-contrast
Comparison: 05/10/2020 from [REDACTED].

CLINICAL DATA: Mental status change.  History of substance abuse.

EXAM:
CT HEAD WITHOUT CONTRAST
TECHNIQUE: Contiguous axial images were obtained from the base of the skull
through the vertex without intravenous contrast.

[Series 3: head wo · axial · 0.49mm/px · z∈[-144,-24]mm · 7 of 34 slices shown, 9 images]
[im 5/34  brain]
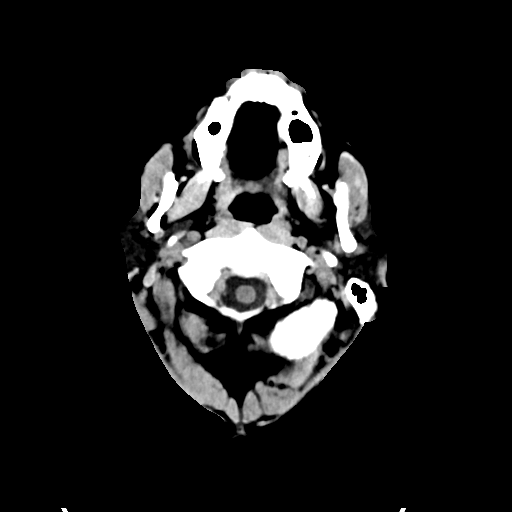
[im 5/34  bone]
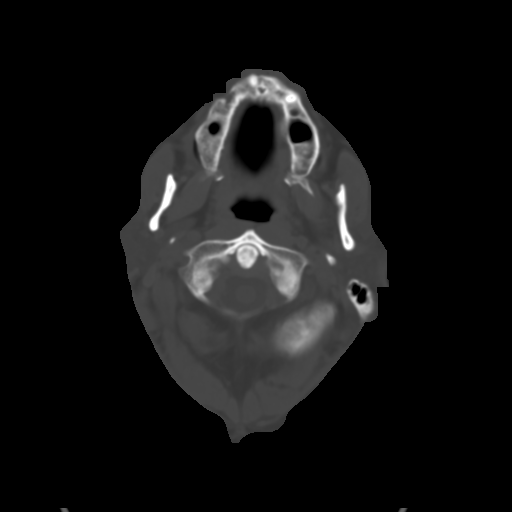
[im 9/34  brain]
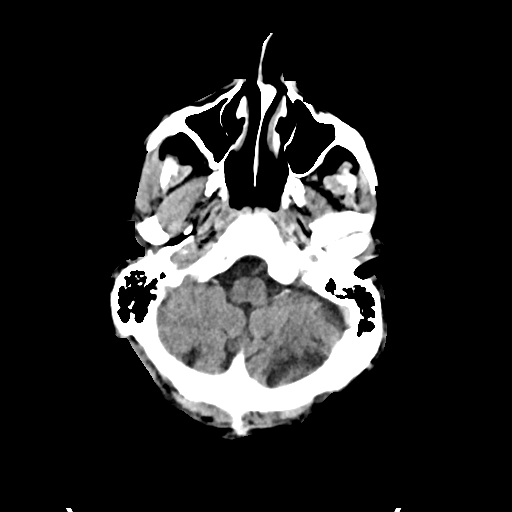
[im 13/34  brain]
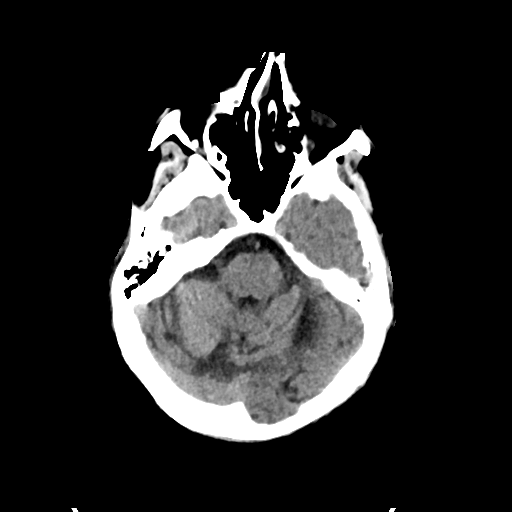
[im 17/34  brain]
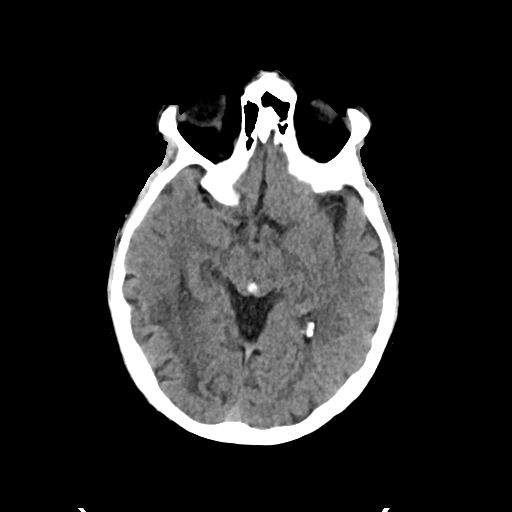
[im 21/34  brain]
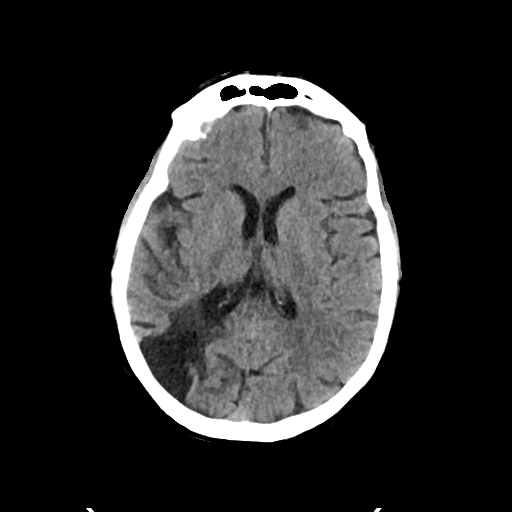
[im 21/34  bone]
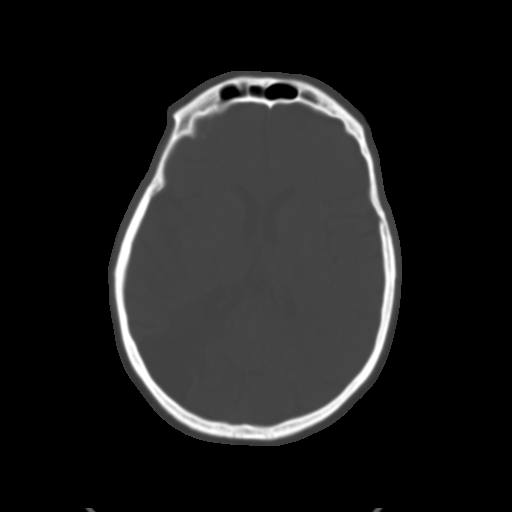
[im 25/34  brain]
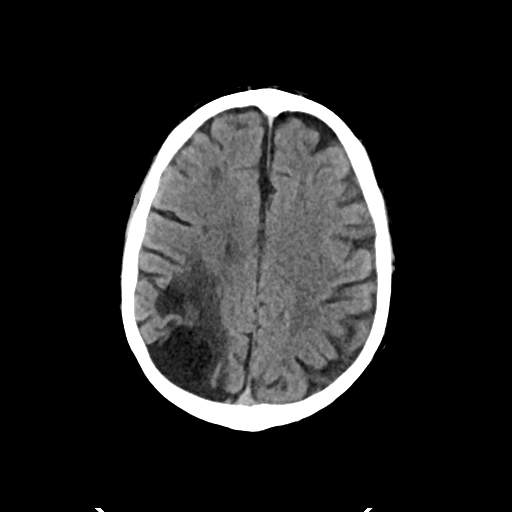
[im 29/34  brain]
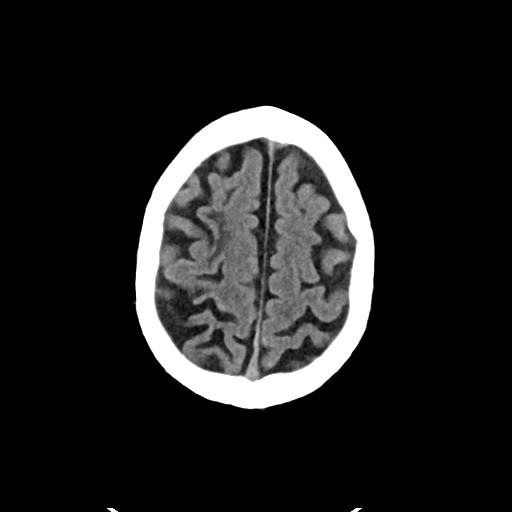

[Series 4: head bone · axial · 0.49mm/px · z∈[-148,-90]mm · 4 of 85 slices shown]
[im 9/85  bone]
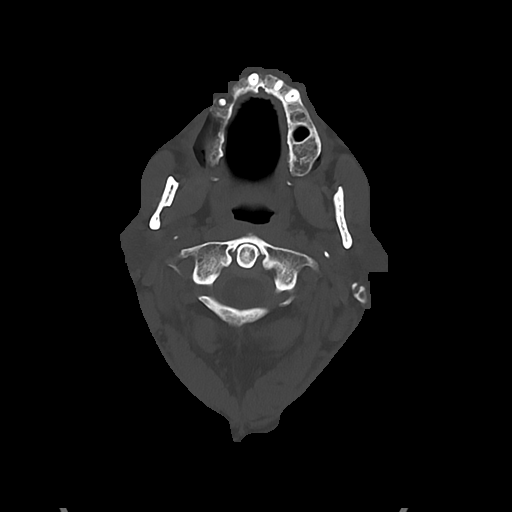
[im 17/85  bone]
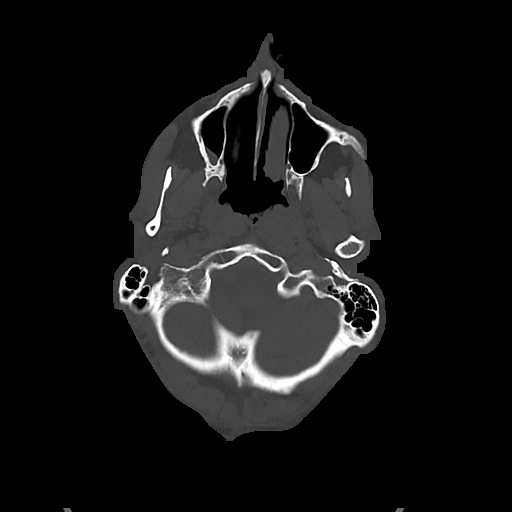
[im 26/85  bone]
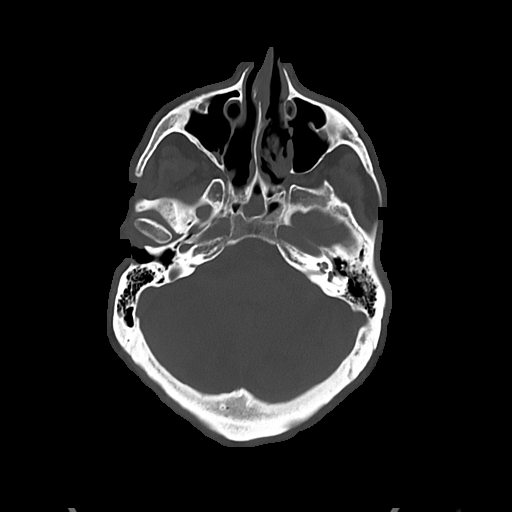
[im 38/85  bone]
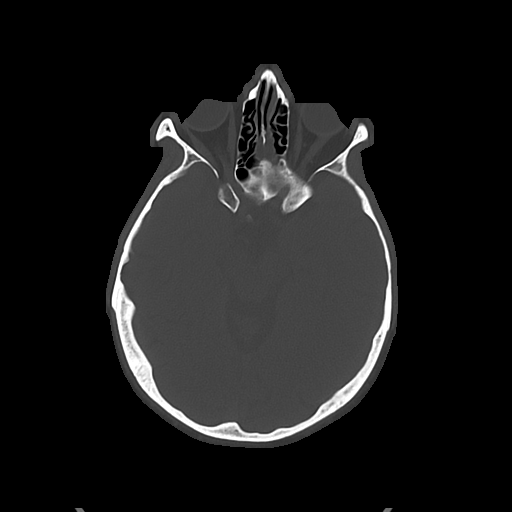

[Series 5: cor soft · coronal · 0.35mm/px · 3 of 75 slices shown]
[im 25/75  brain]
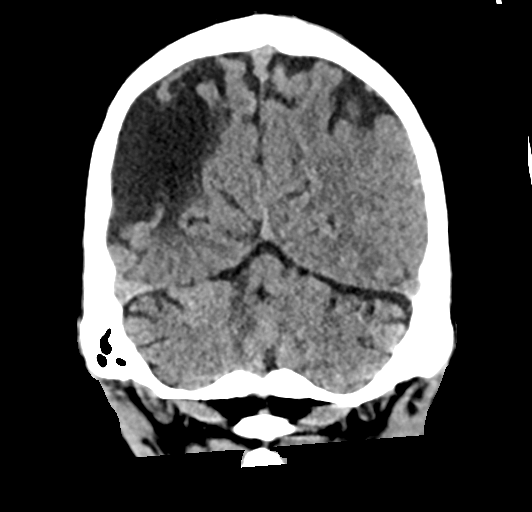
[im 33/75  brain]
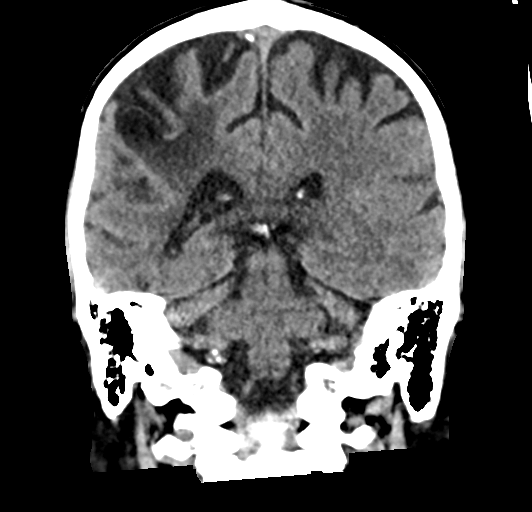
[im 42/75  brain]
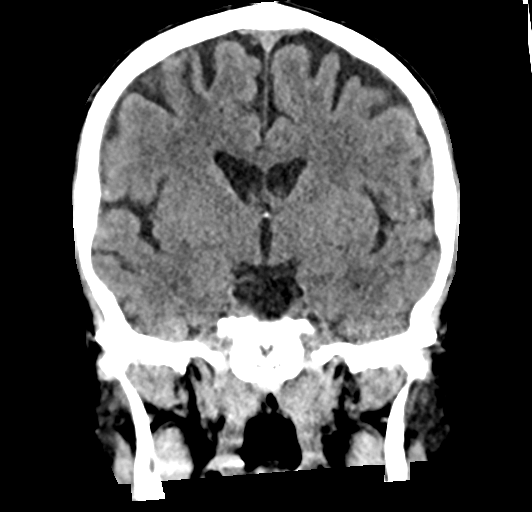

[Series 6: sag soft · sagittal · 0.35mm/px · 3 of 54 slices shown]
[im 18/54  brain]
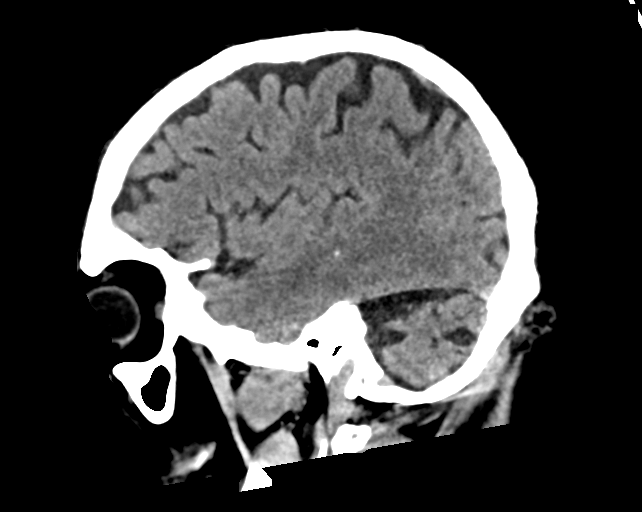
[im 27/54  brain]
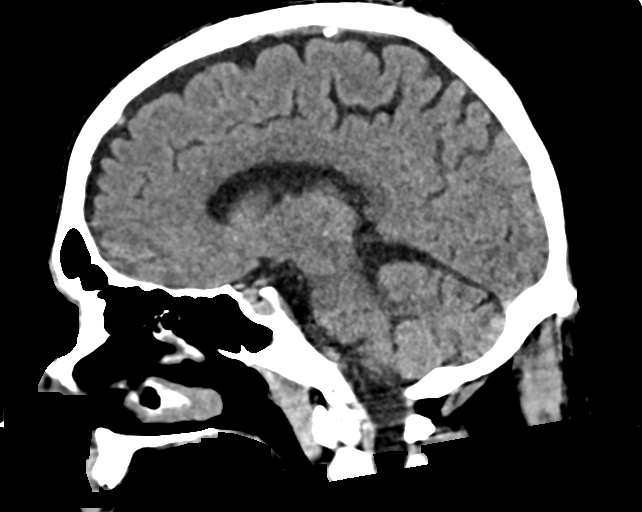
[im 36/54  brain]
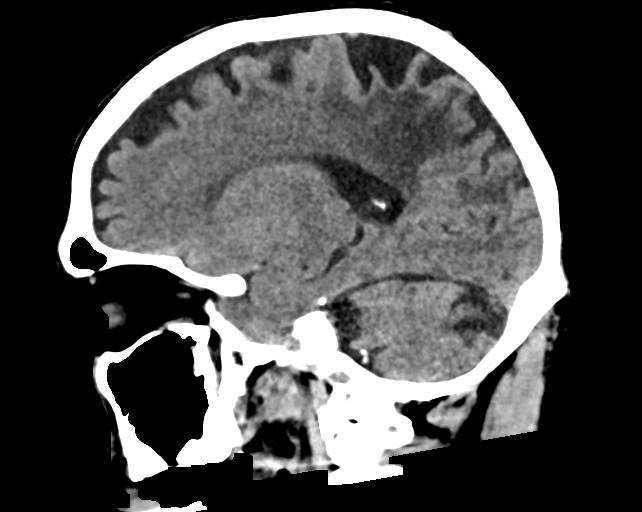

[17 of 47 positions shown; findings below may reference images not displayed]

FINDINGS: Brain: Significantly age advanced cerebral atrophy. Right parietal
encephalomalacia is likely due to chronic MCA infarct. Remote
infarcts involving bilateral cerebellar hemispheres as well. No mass
lesion, hemorrhage, hydrocephalus, acute infarct, intra-axial, or
extra-axial fluid collection.

Vascular: No hyperdense vessel or unexpected calcification.

Skull: No significant soft tissue swelling.  No skull fracture.

Sinuses/Orbits: Normal imaged portions of the orbits and globes.
Remote nasal bone fractures, as before. Mucosal thickening of the
sphenoid sinus, right maxillary sinus. Clear mastoid air cells.

Other: None.
IMPRESSION: 1.  No acute intracranial abnormality.
2. Significantly age advanced cerebral atrophy. Bilateral cerebellar
and right parietal encephalomalacia, likely related to remote
infarcts.
3. Sinus disease.
